# Patient Record
Sex: Female | Born: 1935
Health system: Southern US, Community
[De-identification: ages and names within clinical notes are randomized; demographics above are authoritative.]

## PROBLEM LIST (undated history)

## (undated) HISTORY — PX: TONSILLECTOMY: SUR1361

---

## 2002-09-10 ENCOUNTER — Emergency Department (HOSPITAL_COMMUNITY): Admission: EM | Admit: 2002-09-10 | Discharge: 2002-09-10 | Payer: Self-pay | Admitting: Emergency Medicine

## 2018-07-03 ENCOUNTER — Emergency Department
Admission: EM | Admit: 2018-07-03 | Discharge: 2018-07-03 | Disposition: A | Discharge status: Home / Self Care | Payer: PPO | Attending: Emergency Medicine | Admitting: Emergency Medicine

## 2018-07-03 ENCOUNTER — Emergency Department: Payer: PPO

## 2018-07-03 ENCOUNTER — Encounter: Payer: Self-pay | Admitting: Emergency Medicine

## 2018-07-03 ENCOUNTER — Other Ambulatory Visit: Payer: Self-pay

## 2018-07-03 DIAGNOSIS — R531 Weakness: Secondary | ICD-10-CM | POA: Diagnosis not present

## 2018-07-03 DIAGNOSIS — R29810 Facial weakness: Secondary | ICD-10-CM | POA: Diagnosis not present

## 2018-07-03 DIAGNOSIS — Q758 Other specified congenital malformations of skull and face bones: Secondary | ICD-10-CM | POA: Diagnosis not present

## 2018-07-03 DIAGNOSIS — Q670 Congenital facial asymmetry: Secondary | ICD-10-CM

## 2018-07-03 LAB — COMPREHENSIVE METABOLIC PANEL
ALT: 30 U/L (ref 0–44)
AST: 20 U/L (ref 15–41)
Albumin: 4.1 g/dL (ref 3.5–5.0)
Alkaline Phosphatase: 86 U/L (ref 38–126)
Anion gap: 10 (ref 5–15)
BILIRUBIN TOTAL: 0.8 mg/dL (ref 0.3–1.2)
BUN: 16 mg/dL (ref 8–23)
CO2: 22 mmol/L (ref 22–32)
CREATININE: 0.56 mg/dL (ref 0.44–1.00)
Calcium: 9.4 mg/dL (ref 8.9–10.3)
Chloride: 104 mmol/L (ref 98–111)
GFR calc non Af Amer: >60 mL/min (ref >60)
Glucose, Bld: 383 mg/dL — ABNORMAL HIGH (ref 70–99)
POTASSIUM: 3.8 mmol/L (ref 3.5–5.1)
Sodium: 136 mmol/L (ref 135–145)
TOTAL PROTEIN: 7.3 g/dL (ref 6.5–8.1)

## 2018-07-03 LAB — DIFFERENTIAL
BASOS ABS: 0.1 10*3/uL (ref 0–0.1)
BASOS PCT: 1 %
EOS ABS: 0.1 10*3/uL (ref 0–0.7)
Eosinophils Relative: 1 %
LYMPHS ABS: 2.1 10*3/uL (ref 1.0–3.6)
Lymphocytes Relative: 23 %
MONO ABS: 0.5 10*3/uL (ref 0.2–0.9)
Monocytes Relative: 5 %
Neutro Abs: 6.2 10*3/uL (ref 1.4–6.5)
Neutrophils Relative %: 70 %

## 2018-07-03 LAB — CBC
HEMATOCRIT: 41.4 % (ref 35.0–47.0)
HEMOGLOBIN: 14.5 g/dL (ref 12.0–16.0)
MCH: 29.6 pg (ref 26.0–34.0)
MCHC: 34.9 g/dL (ref 32.0–36.0)
MCV: 84.9 fL (ref 80.0–100.0)
Platelets: 173 10*3/uL (ref 150–440)
RBC: 4.88 MIL/uL (ref 3.80–5.20)
RDW: 13.1 % (ref 11.5–14.5)
WBC: 8.9 10*3/uL (ref 3.6–11.0)

## 2018-07-03 LAB — TROPONIN I

## 2018-07-03 LAB — PROTIME-INR
INR: 0.97
Prothrombin Time: 12.8 seconds (ref 11.4–15.2)

## 2018-07-03 LAB — APTT: APTT: 25 s (ref 24–36)

## 2018-07-03 MED ORDER — BLOOD GLUCOSE MONITOR KIT: PACK | 0 refills | Status: DC

## 2018-07-03 MED ORDER — ACYCLOVIR 400 MG PO TABS: 400.0000 mg | ORAL_TABLET | Freq: Every day | ORAL | 0 refills | Status: AC

## 2018-07-03 NOTE — ED Provider Notes (Signed)
[facility] Emergency Department Provider Note   ____________________________________________   I have reviewed the triage vital signs and the nursing notes.   HISTORY  Chief Complaint Facial Droop   History limited by: Not Limited   HPI Melissa Armstrong is a 82 y.o. female who presents to the emergency department today because of concerns for right-sided facial weakness.  Patient states that the symptoms started 2 days ago.  Slightly unclear why she waited until today to come in.  She noticed that her right side of her mouth is not working appropriately.  Additionally she notes it was hard to wake with the right eye.  Patient denies any extremity weakness or numbness.  She denies any recent illness, fever, chest pain, shortness of breath, abdominal pain nausea vomiting.  Denies similar symptoms in the past.  She states it is been years since she last went to a doctor.    Per medical record review patient has a history of tonsillectomy  History reviewed. No pertinent past medical history.  There are no active problems to display for this patient.   Past Surgical History:  Procedure Laterality Date  . TONSILLECTOMY      Prior to Admission medications   Not on File    Allergies Patient has no known allergies.  History reviewed. No pertinent family history.  Social History Social History   Tobacco Use  . Smoking status: Never Smoker  . Smokeless tobacco: Never Used  Substance Use Topics  . Alcohol use: Never    Frequency: Never  . Drug use: Never    Review of Systems Constitutional: No fever/chills Eyes: No visual changes. ENT: No sore throat. Cardiovascular: Denies chest pain. Respiratory: Denies shortness of breath. Gastrointestinal: No abdominal pain.  No nausea, no vomiting.  No diarrhea.   Genitourinary: Negative for dysuria. Musculoskeletal: Negative for back pain. Skin: Negative for rash. Neurological: Positive for right sided  facial weakness.   ____________________________________________   PHYSICAL EXAM:  VITAL SIGNS: ED Triage Vitals [07/03/18 1333]  Enc Vitals Group     BP (!) 157/105     Pulse Rate (!) 102     Resp 18     Temp 98.4 F (36.9 C)     Temp Source Oral     SpO2 98 %     Weight 160 lb (72.6 kg)     Height 5' (1.524 m)     Head Circumference      Peak Flow      Pain Score 0   Constitutional: Alert and oriented.  Eyes: Conjunctivae are normal.  ENT      Head: Normocephalic and atraumatic.      Nose: No congestion/rhinnorhea.      Mouth/Throat: Mucous membranes are moist.      Neck: No stridor. Hematological/Lymphatic/Immunilogical: No cervical lymphadenopathy. Cardiovascular: Normal rate, regular rhythm.  No murmurs, rubs, or gallops. Respiratory: Normal respiratory effort without tachypnea nor retractions. Breath sounds are clear and equal bilaterally. No wheezes/rales/rhonchi. Gastrointestinal: Soft and non tender. No rebound. No guarding.  Genitourinary: Deferred Musculoskeletal: Normal range of motion in all extremities. No lower extremity edema. Neurologic:  Right sided facial droop, weakness to right forehead. Decreased right periorbital muscles. No upper or lower extremity drift. Finger to nose normal.  Skin:  Skin is warm, dry and intact. No rash noted. Psychiatric: Mood and affect are normal. Speech and behavior are normal. Patient exhibits appropriate insight and judgment.  ____________________________________________    LABS (pertinent positives/negatives)  CBC  wnl CMP wnl except glu 383 Trop <0.03  ____________________________________________   EKG  I, Phineas SemenGraydon Zyon Grout, attending physician, personally viewed and interpreted this EKG  EKG Time: 1343 Rate: 114 Rhythm: sinus tachycardia Axis: normal Intervals: qtc 468 QRS: narrow ST changes: no st elevation Impression: abnormal ekg  ____________________________________________    RADIOLOGY  CT  head No acute findings  ____________________________________________   PROCEDURES  Procedures  ____________________________________________   INITIAL IMPRESSION / ASSESSMENT AND PLAN / ED COURSE  Pertinent labs & imaging results that were available during my care of the patient were reviewed by me and considered in my medical decision making (see chart for details).   Patient presented to the emergency department today because of concerns for right facial weakness for 2 days.  On exam patient's whole right side of her face is weak.  I did have a discussion with the patient.  At this point I think Bell's palsy likely given the involvement of the forehead.  However I did discuss with the patient the importance of an MRI to evaluate for stroke.  Patient however declined MRI at this time.  Did discuss risk of possibly missing a stroke including death and permanent disability.  Additionally discussed with patient finding of elevated glucose.  She does not have any history of diabetes.  She is not interested in starting medication at this time she would rather try diet changes.  At this point given that the patient declines MRI will treat for Bell's palsy.  However given elevated blood sugar and that patient is not on any medications I am hesitant to give steroids.  Will trial patient on acyclovir.  Patient states she will follow-up with primary care.   ____________________________________________   FINAL CLINICAL IMPRESSION(S) / ED DIAGNOSES  Final diagnoses:  Facial asymmetry     Note: This dictation was prepared with Dragon dictation. Any transcriptional errors that result from this process are unintentional     Phineas SemenGoodman, Gordon Carlson, MD 07/03/18 [phone]

## 2018-07-03 NOTE — ED Notes (Signed)
FIRST NURSE NOTE: [facility] pt has been having RT sided facial droop since Saturday. PT in NAD at this time

## 2018-07-03 NOTE — ED Triage Notes (Signed)
Pt to ED from home c/o right facial droop since 1600 Saturday suddenly, denies numbness or tingling, (+) sensation bilaterally, A&Ox4 speaking in complete and coherent sentences.  Denies hx of stroke, blood thinners, or other pertinent medical hx.

## 2018-07-03 NOTE — Discharge Instructions (Addendum)
Please seek medical attention for any high fevers, chest pain, shortness of breath, change in behavior, persistent vomiting, bloody stool or any other new or concerning symptoms.  

## 2018-08-09 DIAGNOSIS — Z0000 Encounter for general adult medical examination without abnormal findings: Secondary | ICD-10-CM | POA: Diagnosis not present

## 2018-08-09 DIAGNOSIS — I10 Essential (primary) hypertension: Secondary | ICD-10-CM | POA: Diagnosis not present

## 2018-08-09 DIAGNOSIS — E119 Type 2 diabetes mellitus without complications: Secondary | ICD-10-CM | POA: Diagnosis not present

## 2018-11-08 DIAGNOSIS — E118 Type 2 diabetes mellitus with unspecified complications: Secondary | ICD-10-CM | POA: Diagnosis not present

## 2018-11-08 DIAGNOSIS — E119 Type 2 diabetes mellitus without complications: Secondary | ICD-10-CM | POA: Diagnosis not present

## 2018-11-08 DIAGNOSIS — I10 Essential (primary) hypertension: Secondary | ICD-10-CM | POA: Diagnosis not present

## 2018-11-08 DIAGNOSIS — Z23 Encounter for immunization: Secondary | ICD-10-CM | POA: Diagnosis not present

## 2019-03-28 DIAGNOSIS — F411 Generalized anxiety disorder: Secondary | ICD-10-CM | POA: Insufficient documentation

## 2019-03-28 DIAGNOSIS — J309 Allergic rhinitis, unspecified: Secondary | ICD-10-CM | POA: Diagnosis not present

## 2019-05-10 DIAGNOSIS — F411 Generalized anxiety disorder: Secondary | ICD-10-CM | POA: Diagnosis not present

## 2019-08-07 DIAGNOSIS — F411 Generalized anxiety disorder: Secondary | ICD-10-CM | POA: Diagnosis not present

## 2019-09-12 IMAGING — CT CT HEAD W/O CM
3 series · 15 of 47 positions shown, 18 images · non-contrast
Comparison: None.

CLINICAL DATA: Right facial droop beginning 2 days ago.

EXAM:
CT HEAD WITHOUT CONTRAST
TECHNIQUE: Contiguous axial images were obtained from the base of the skull
through the vertex without intravenous contrast.

[Series 2: head wo · axial · 0.43mm/px · z∈[-106,+19]mm · 9 of 30 slices shown, 12 images]
[im 3/30  brain]
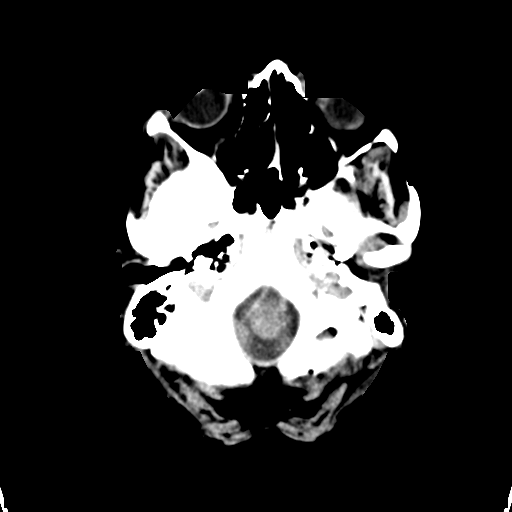
[im 3/30  bone]
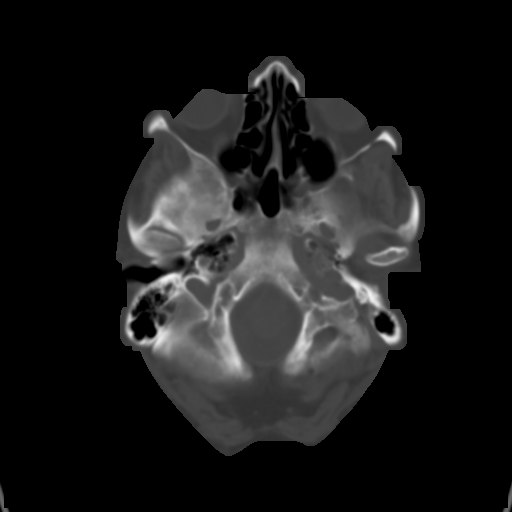
[im 6/30  brain]
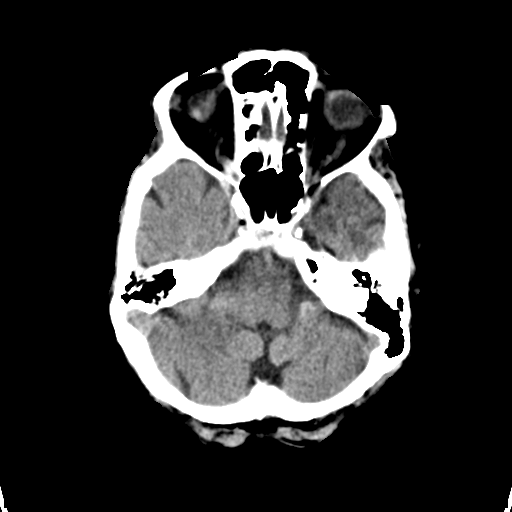
[im 9/30  brain]
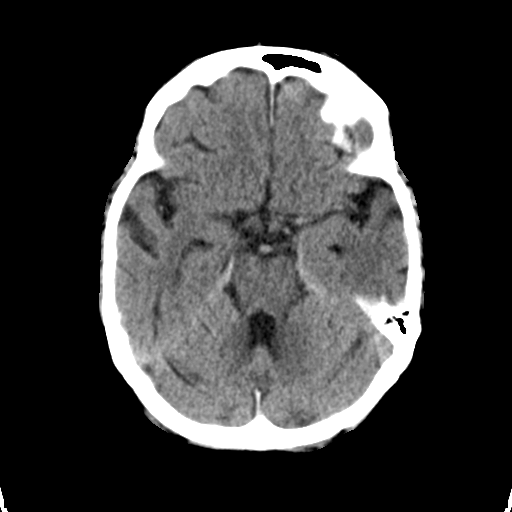
[im 12/30  brain]
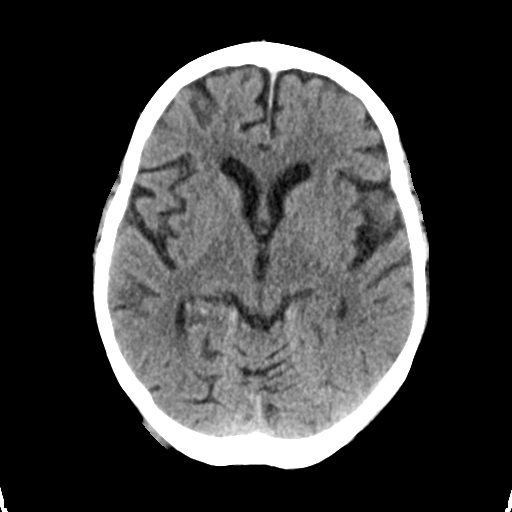
[im 16/30  brain]
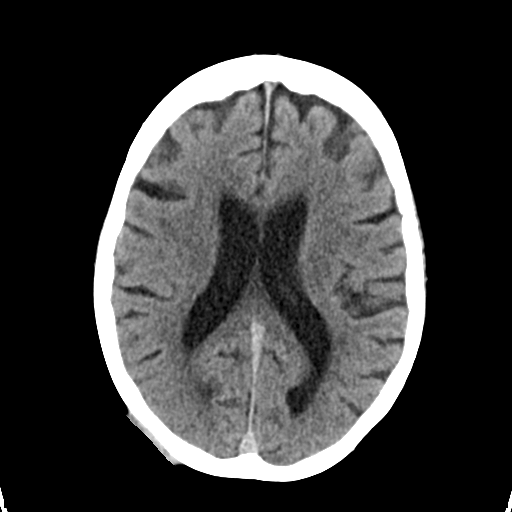
[im 16/30  bone]
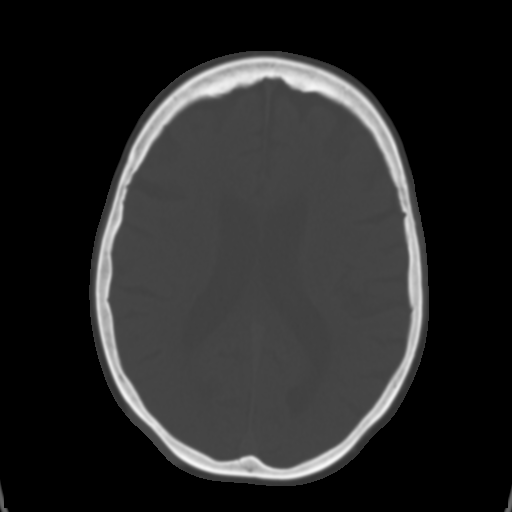
[im 19/30  brain]
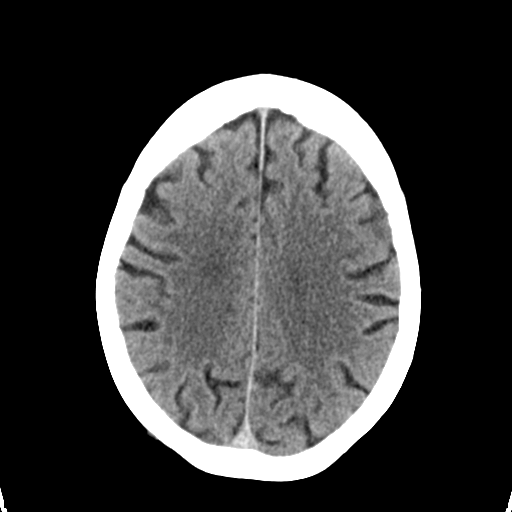
[im 22/30  brain]
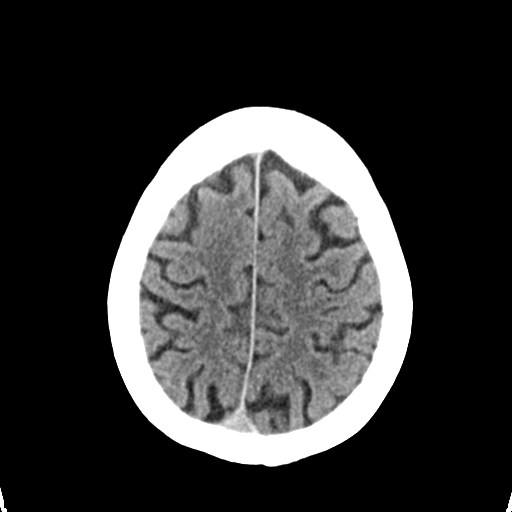
[im 25/30  brain]
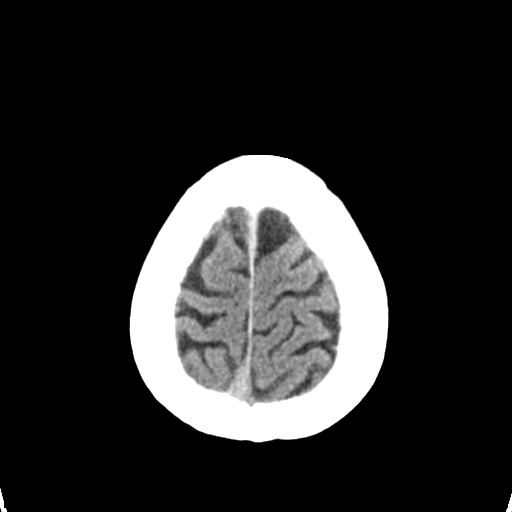
[im 28/30  brain]
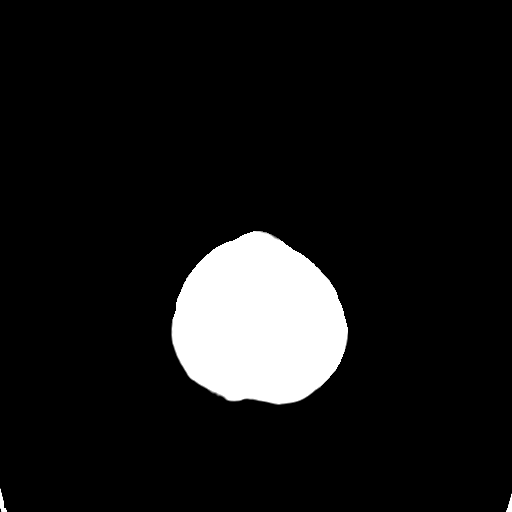
[im 28/30  bone]
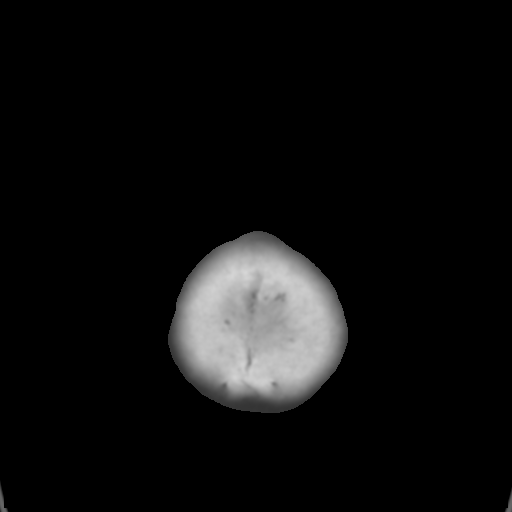

[Series 4: coronal soft tissue · coronal · 0.32mm/px · 3 of 67 slices shown]
[im 23/67  brain]
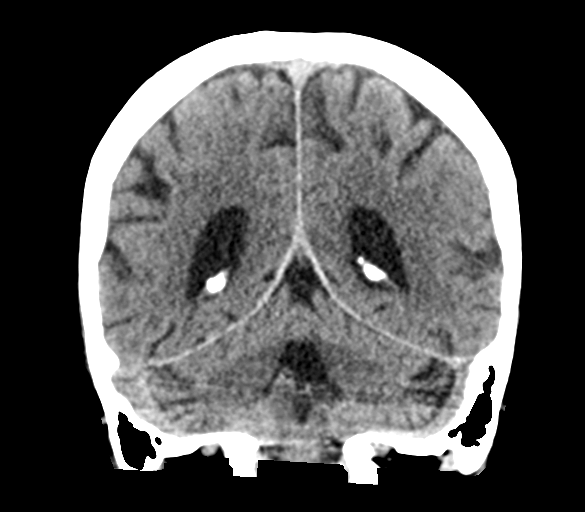
[im 30/67  brain]
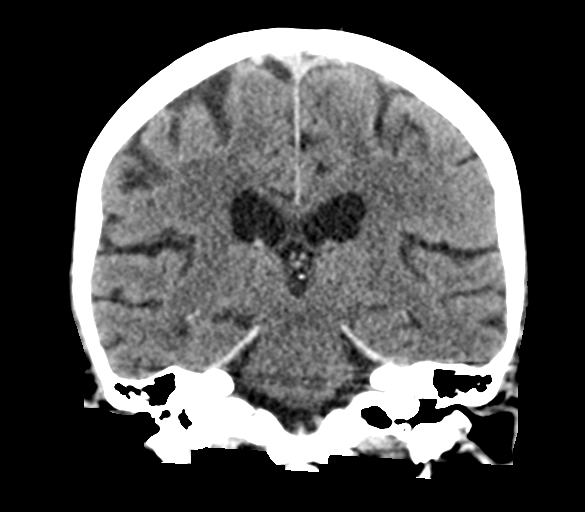
[im 37/67  brain]
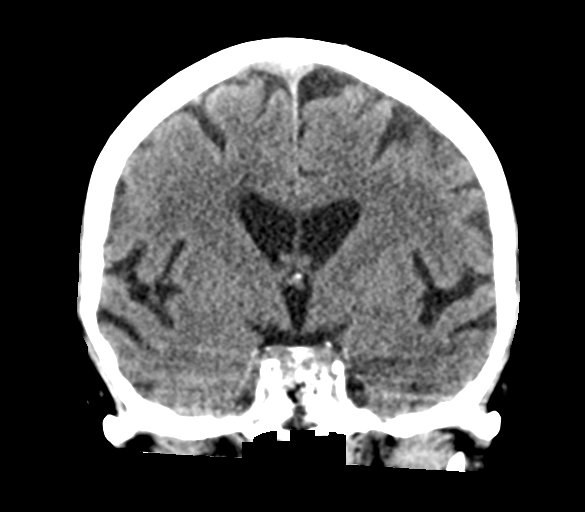

[Series 5: sagittal soft tissue · sagittal · 0.34mm/px · 3 of 55 slices shown]
[im 19/55  brain]
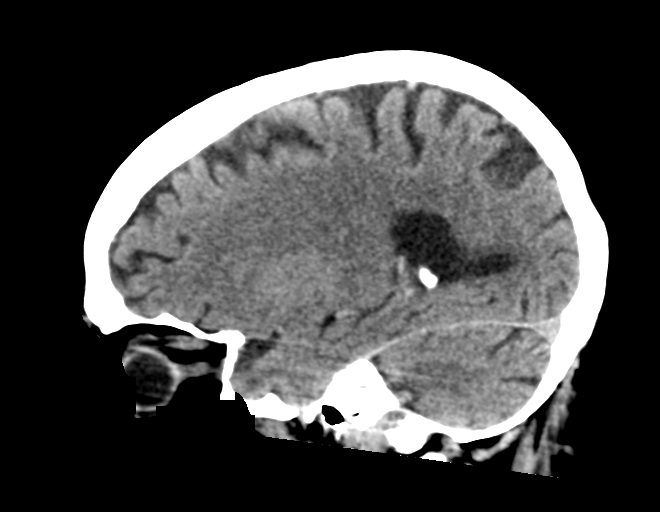
[im 28/55  brain]
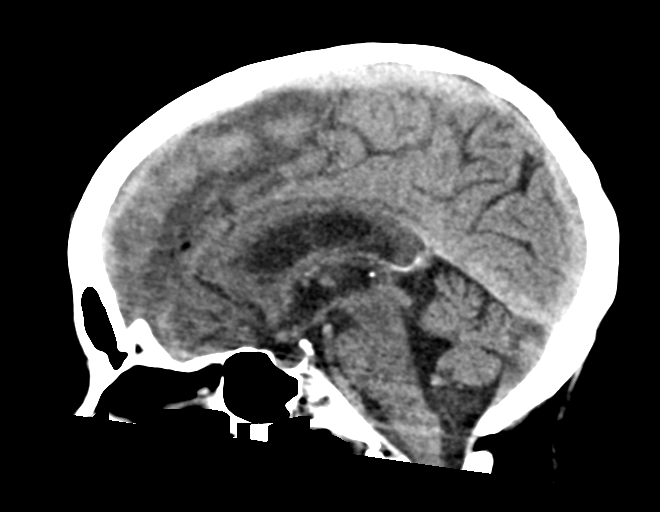
[im 37/55  brain]
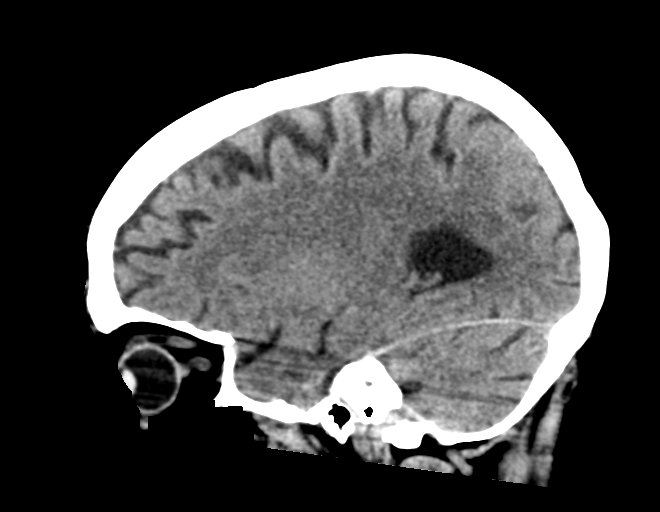

[15 of 47 positions shown; findings below may reference images not displayed]

FINDINGS: Brain: Age related volume loss. No evidence of old or acute focal
infarction, mass lesion, hemorrhage, hydrocephalus or extra-axial
collection.

Vascular: There is atherosclerotic calcification of the major
vessels at the base of the brain.

Skull: Negative

Sinuses/Orbits: Clear/normal

Other: Abnormal density in the fat of the right parietal scalp,
presumably a chronic skin or scalp lesion.
IMPRESSION: No acute or subacute finding. No cause of the presenting symptoms is
identified. Age related volume loss.

Presumably chronic skin or scalp lesion in the right parietal
region. Nonspecific. No abnormality of the underlying calvarium.

## 2019-09-19 DIAGNOSIS — F411 Generalized anxiety disorder: Secondary | ICD-10-CM | POA: Diagnosis not present

## 2023-08-16 DIAGNOSIS — Z01818 Encounter for other preprocedural examination: Secondary | ICD-10-CM | POA: Diagnosis not present

## 2023-08-16 DIAGNOSIS — H25813 Combined forms of age-related cataract, bilateral: Secondary | ICD-10-CM | POA: Diagnosis not present

## 2023-09-08 DIAGNOSIS — H25811 Combined forms of age-related cataract, right eye: Secondary | ICD-10-CM | POA: Diagnosis not present

## 2023-10-13 DIAGNOSIS — H25812 Combined forms of age-related cataract, left eye: Secondary | ICD-10-CM | POA: Diagnosis not present

## 2023-12-26 DIAGNOSIS — R030 Elevated blood-pressure reading, without diagnosis of hypertension: Secondary | ICD-10-CM | POA: Diagnosis not present

## 2023-12-26 DIAGNOSIS — M533 Sacrococcygeal disorders, not elsewhere classified: Secondary | ICD-10-CM | POA: Diagnosis not present

## 2023-12-26 DIAGNOSIS — M160 Bilateral primary osteoarthritis of hip: Secondary | ICD-10-CM | POA: Diagnosis not present

## 2023-12-26 DIAGNOSIS — F411 Generalized anxiety disorder: Secondary | ICD-10-CM | POA: Diagnosis not present

## 2024-03-13 DIAGNOSIS — Z136 Encounter for screening for cardiovascular disorders: Secondary | ICD-10-CM | POA: Diagnosis not present

## 2024-03-13 DIAGNOSIS — R030 Elevated blood-pressure reading, without diagnosis of hypertension: Secondary | ICD-10-CM | POA: Diagnosis not present

## 2024-03-13 DIAGNOSIS — F411 Generalized anxiety disorder: Secondary | ICD-10-CM | POA: Diagnosis not present

## 2024-05-11 ENCOUNTER — Other Ambulatory Visit: Payer: Self-pay

## 2024-05-11 DIAGNOSIS — M79651 Pain in right thigh: Secondary | ICD-10-CM | POA: Diagnosis not present

## 2024-05-11 DIAGNOSIS — M899 Disorder of bone, unspecified: Secondary | ICD-10-CM

## 2024-05-11 DIAGNOSIS — M25551 Pain in right hip: Secondary | ICD-10-CM | POA: Diagnosis not present

## 2024-05-13 ENCOUNTER — Ambulatory Visit

## 2024-05-17 ENCOUNTER — Ambulatory Visit
Admission: RE | Admit: 2024-05-17 | Discharge: 2024-05-17 | Disposition: A | Discharge status: Home / Self Care | Source: Ambulatory Visit

## 2024-05-17 DIAGNOSIS — M899 Disorder of bone, unspecified: Secondary | ICD-10-CM | POA: Insufficient documentation

## 2024-05-17 DIAGNOSIS — M79604 Pain in right leg: Secondary | ICD-10-CM | POA: Diagnosis not present

## 2024-05-17 DIAGNOSIS — M898X5 Other specified disorders of bone, thigh: Secondary | ICD-10-CM | POA: Diagnosis not present

## 2024-05-17 MED ORDER — GADOBUTROL 1 MMOL/ML IV SOLN
7.0000 mL | Freq: Once | INTRAVENOUS | Status: AC | PRN
  Administered 2024-05-17: 7 mL via INTRAVENOUS

## 2024-05-29 ENCOUNTER — Encounter: Payer: Self-pay | Admitting: Oncology

## 2024-05-29 ENCOUNTER — Other Ambulatory Visit: Payer: Self-pay | Admitting: *Deleted

## 2024-05-29 ENCOUNTER — Inpatient Hospital Stay

## 2024-05-29 ENCOUNTER — Inpatient Hospital Stay: Attending: Oncology | Admitting: Oncology

## 2024-05-29 VITALS — BP 151/84 | HR 102 | Temp 98.5°F | Resp 16 | Ht 60.0 in

## 2024-05-29 DIAGNOSIS — R978 Other abnormal tumor markers: Secondary | ICD-10-CM | POA: Diagnosis not present

## 2024-05-29 DIAGNOSIS — Z800 Family history of malignant neoplasm of digestive organs: Secondary | ICD-10-CM | POA: Diagnosis not present

## 2024-05-29 DIAGNOSIS — D649 Anemia, unspecified: Secondary | ICD-10-CM | POA: Diagnosis not present

## 2024-05-29 DIAGNOSIS — Z8041 Family history of malignant neoplasm of ovary: Secondary | ICD-10-CM | POA: Insufficient documentation

## 2024-05-29 DIAGNOSIS — F419 Anxiety disorder, unspecified: Secondary | ICD-10-CM | POA: Insufficient documentation

## 2024-05-29 DIAGNOSIS — Z801 Family history of malignant neoplasm of trachea, bronchus and lung: Secondary | ICD-10-CM | POA: Diagnosis not present

## 2024-05-29 DIAGNOSIS — L989 Disorder of the skin and subcutaneous tissue, unspecified: Secondary | ICD-10-CM | POA: Insufficient documentation

## 2024-05-29 DIAGNOSIS — M898X5 Other specified disorders of bone, thigh: Secondary | ICD-10-CM | POA: Diagnosis present

## 2024-05-29 DIAGNOSIS — M899 Disorder of bone, unspecified: Secondary | ICD-10-CM

## 2024-05-29 DIAGNOSIS — D696 Thrombocytopenia, unspecified: Secondary | ICD-10-CM | POA: Insufficient documentation

## 2024-05-29 LAB — CBC WITH DIFFERENTIAL/PLATELET
Abs Immature Granulocytes: 0.03 10*3/uL (ref 0.00–0.07)
Basophils Absolute: 0 10*3/uL (ref 0.0–0.1)
Basophils Relative: 0 %
Eosinophils Absolute: 0.1 10*3/uL (ref 0.0–0.5)
Eosinophils Relative: 1 %
HCT: 32.1 % — ABNORMAL LOW (ref 36.0–46.0)
Hemoglobin: 11.1 g/dL — ABNORMAL LOW (ref 12.0–15.0)
Immature Granulocytes: 0 %
Lymphocytes Relative: 16 %
Lymphs Abs: 1.2 10*3/uL (ref 0.7–4.0)
MCH: 29.8 pg (ref 26.0–34.0)
MCHC: 34.6 g/dL (ref 30.0–36.0)
MCV: 86.1 fL (ref 80.0–100.0)
Monocytes Absolute: 0.4 10*3/uL (ref 0.1–1.0)
Monocytes Relative: 6 %
Neutro Abs: 5.6 10*3/uL (ref 1.7–7.7)
Neutrophils Relative %: 77 %
Platelets: 143 10*3/uL — ABNORMAL LOW (ref 150–400)
RBC: 3.73 MIL/uL — ABNORMAL LOW (ref 3.87–5.11)
RDW: 13.3 % (ref 11.5–15.5)
WBC: 7.4 10*3/uL (ref 4.0–10.5)
nRBC: 0 % (ref 0.0–0.2)

## 2024-05-29 LAB — COMPREHENSIVE METABOLIC PANEL WITH GFR
ALT: 29 U/L (ref 0–44)
AST: 21 U/L (ref 15–41)
Albumin: 4.2 g/dL (ref 3.5–5.0)
Alkaline Phosphatase: 69 U/L (ref 38–126)
Anion gap: 8 (ref 5–15)
BUN: 20 mg/dL (ref 8–23)
CO2: 23 mmol/L (ref 22–32)
Calcium: 9.2 mg/dL (ref 8.9–10.3)
Chloride: 104 mmol/L (ref 98–111)
Creatinine, Ser: 0.6 mg/dL (ref 0.44–1.00)
GFR, Estimated: >60 mL/min (ref >60)
Glucose, Bld: 129 mg/dL — ABNORMAL HIGH (ref 70–99)
Potassium: 3.5 mmol/L (ref 3.5–5.1)
Sodium: 135 mmol/L (ref 135–145)
Total Bilirubin: 1 mg/dL (ref 0.0–1.2)
Total Protein: 7.1 g/dL (ref 6.5–8.1)

## 2024-05-29 MED ORDER — HYDROCODONE-ACETAMINOPHEN 5-325 MG PO TABS: 1.0000 | ORAL_TABLET | Freq: Four times a day (QID) | ORAL | 0 refills | Status: DC | PRN

## 2024-05-29 NOTE — Progress Notes (Signed)
 Patient wants to see if she can get something sent in for anxiety. She is very anxious and shaky today.

## 2024-05-29 NOTE — Progress Notes (Signed)
 [facility]  Telephone:([phone] [phone]) [phone]  ID: Del Favia OB: 08/16/1936  MR#: [id number]  AOZ#:[id number]  Patient Care Team: Monique Ano, MD as PCP - General (Family Medicine) Shellie Dials, MD as Consulting Physician (Oncology)  CHIEF COMPLAINT: Right femur lesion.  INTERVAL HISTORY: Patient is an 88 year old female who underwent imaging with x-ray and MRI for right leg pain.  MRI noted a well-circumscribed lesion in her right femur of unclear etiology, but is possibly suspicious for malignancy.  She also reports a large lesion on the back of her scalp that she has been ignoring for months and afraid to discuss.  This appears to be consistent with a squamous cell carcinoma.  She otherwise feels well.  She has no neurologic complaints.  She denies any recent fevers or illnesses.  She has a good appetite and denies weight loss.  She has no chest pain, shortness of breath, cough, or hemoptysis.  She denies any nausea, vomiting, constipation, or diarrhea.  She has no melena or hematochezia.  She has no urinary complaints.  Patient offers no further specific complaints today.  REVIEW OF SYSTEMS:   Review of Systems  Constitutional: Negative.  Negative for fever, malaise/fatigue and weight loss.  Respiratory: Negative.  Negative for cough, hemoptysis and shortness of breath.   Cardiovascular: Negative.  Negative for chest pain and leg swelling.  Gastrointestinal:  Negative for abdominal pain, blood in stool and melena.  Genitourinary: Negative.  Negative for frequency.  Musculoskeletal:        Right upper leg pain.  Skin: Negative.  Negative for rash.  Neurological: Negative.  Negative for dizziness, focal weakness, weakness and headaches.  Psychiatric/Behavioral: Negative.  The patient is not nervous/anxious.     As per HPI. Otherwise, a complete review of systems is negative.  PAST MEDICAL HISTORY: History reviewed. No pertinent past  medical history.  PAST SURGICAL HISTORY: Past Surgical History:  Procedure Laterality Date   TONSILLECTOMY      FAMILY HISTORY: Family History  Problem Relation Age of Onset   Cancer Mother        colon cancer   Cancer Father        Lung cancer   Cancer Sister        colon cancer   Cancer Niece        ovarian cancer    ADVANCED DIRECTIVES (Y/N):  N  HEALTH MAINTENANCE: Social History   Tobacco Use   Smoking status: Never   Smokeless tobacco: Never  Substance Use Topics   Alcohol use: Never   Drug use: Never     Colonoscopy:  PAP:  Bone density:  Lipid panel:  Allergies  Allergen Reactions   Penicillin G Other (See Comments)   Trazodone Nausea Only   Venlafaxine Diarrhea and Nausea Only    Current Outpatient Medications  Medication Sig Dispense Refill   acetaminophen (TYLENOL) 325 MG tablet Take 650 mg by mouth every 6 (six) hours as needed.     blood glucose meter kit and supplies KIT Dispense based on patient and insurance preference. Use up to four times daily as directed. (FOR ICD-9 250.00, 250.01). 1 each 0   traMADol (ULTRAM) 50 MG tablet Take 50 mg by mouth every 6 (six) hours as needed.     HYDROcodone-acetaminophen (NORCO/VICODIN) 5-325 MG tablet Take 1 tablet by mouth every 6 (six) hours as needed for moderate pain (pain score 4-6). 30 tablet 0   hydrOXYzine (ATARAX) 10 MG tablet Take 10  mg by mouth in the morning and at bedtime. (Patient not taking: Reported on 05/29/2024)     No current facility-administered medications for this visit.    OBJECTIVE: Vitals:   05/29/24 1320  BP: (!) 151/84  Pulse: (!) 102  Resp: 16  Temp: 98.5 F (36.9 C)  SpO2: 98%     Body mass index is 31.25 kg/m.    ECOG FS:1 - Symptomatic but completely ambulatory  General: Well-developed, well-nourished, no acute distress. Eyes: Pink conjunctiva, anicteric sclera. HEENT: Normocephalic, moist mucous membranes. Lungs: No audible wheezing or coughing. Heart: Regular  rate and rhythm. Abdomen: Soft, nontender, no obvious distention. Musculoskeletal: No edema, cyanosis, or clubbing. Neuro: Alert, answering all questions appropriately. Cranial nerves grossly intact. Skin: No rashes or petechiae noted. Psych: Normal affect. Lymphatics: No cervical, calvicular, axillary or inguinal LAD.   LAB RESULTS:  Lab Results  Component Value Date   NA 135 05/29/2024   K 3.5 05/29/2024   CL 104 05/29/2024   CO2 23 05/29/2024   GLUCOSE 129 (H) 05/29/2024   BUN 20 05/29/2024   CREATININE 0.60 05/29/2024   CALCIUM 9.2 05/29/2024   PROT 7.1 05/29/2024   ALBUMIN 4.2 05/29/2024   AST 21 05/29/2024   ALT 29 05/29/2024   ALKPHOS 69 05/29/2024   BILITOT 1.0 05/29/2024   GFRNONAA >60 05/29/2024   GFRAA >60 07/03/2018    Lab Results  Component Value Date   WBC 7.4 05/29/2024   NEUTROABS 5.6 05/29/2024   HGB 11.1 (L) 05/29/2024   HCT 32.1 (L) 05/29/2024   MCV 86.1 05/29/2024   PLT 143 (L) 05/29/2024     STUDIES: MR FEMUR RIGHT W WO CONTRAST Addendum Date: 05/17/2024 ADDENDUM REPORT: 05/17/2024 16:25 THE IMPRESSION 2.  WAS BLANK ON THE ORIGINAL REPORT.  THIS SHOULD READ: 2. Findings are concerning for a primary or secondary malignancy within the distal femoral diaphysis. An aggressive process of infection is considered, however the lesion appears well-circumscribed, and a bone malignancy is of greatest concern. Electronically Signed   By: Bertina Broccoli M.D.   On: 05/17/2024 16:25   Result Date: 05/17/2024 CLINICAL DATA:  Right leg pain.  Bony lesion seen on outside x-rays. EXAM: MRI OF THE RIGHT FEMUR WITHOUT AND WITH CONTRAST TECHNIQUE: Multiplanar, multisequence MR imaging of the right femur was performed both before and after administration of intravenous contrast. CONTRAST:  7mL GADAVIST  GADOBUTROL  1 MMOL/ML IV SOLN COMPARISON:  None Available. FINDINGS: Please note that interpretation has been requested on this MRI even though the outside radiographs on  which a bone lesion was reportedly seen are unavailable. Please note that inability to correlate with these outside radiographs limits this MRI interpretation. Bones/Joint/Cartilage There is an oval, well-circumscribed lesion within the distal femoral diaphysis measuring up to 2.5 x 2.2 x 4.7 cm (transverse by AP by craniocaudal) demonstrating decreased T1 signal (slightly hyperintense to skeletal muscle), intermediate T2 signal, and moderate diffuse enhancement. This causes moderate thinning of the anterior and anteromedial distal femoral diaphyseal cortex (axial series 13 image 54). There is apparent aggressive periosteal reaction within the anterior aspect of this lesion (axial series 13, image 54, sagittal series 32, image 20). There is fluid tracking along the lateral aspect of the distal femur, deep to the vastus lateralis and posterior the vastus intermedius muscles, measuring up to 5 mm in transverse thickness, and wrapping around the anterior cortex, along an approximate 12 cm craniocaudal length of the bone. There is also patchy intermediate T2 signal within the femoral diaphysis  just proximal to this lesion (axial series 29, image 51 and sagittal series 16, image 19) and within the anterior distal femoral metadiaphysis just distal to the dominant bone lesion (axial series 29, image 62 and sagittal series 16, image 19). There is a nonspecific intermediate T1, intermediate T2, and mildly enhancing oval lesion within the mid femoral diaphysis just proximal to the mid height, measuring up to approximately 8 x 12 x 14 mm (transverse by AP by craniocaudal). It is difficult to exclude an additional aggressive bone lesion, although this could represent focal red marrow reconversion. Moderate to severe pubic symphysis joint space narrowing and peripheral osteophytosis. Ligaments The knee mediolateral collateral ligaments appear intact. Muscles and Tendons There is mild intermediate T2 signal likely complex fluid  and periosteal reaction around the distal femoral bone tumor extending into the adjacent deep aspect of the vastus intermedius muscle. No definite tendon tear is seen. Soft tissues The uterus is in the partially visualized. It appears heterogeneous and multilobular, grossly compatible with fibroids. There is a partially visualized oval, decreased T1 and decreased T2 signal focus within the right hemipelvis that measuring up to approximately 3.7 cm that may represent a stool ball (axial image 4/80), with mild mass effect on the superior right urinary bladder. It is difficult to exclude a right adnexal lesion on this limited study. Recommend correlation with dedicated pelvic ultrasound or CT of the abdomen pelvis with contrast, as may be desired for a metastatic workup given the distal right femoral bone lesion. IMPRESSION: 1. Aggressive oval lesion within the distal femoral diaphysis with thinning of the anterior cortex, moderate periosteal edema, and aggressive anterior periosteal reaction. 2. 3. There is a nonspecific intermediate T1, intermediate T2, and mildly enhancing oval lesion within the mid femoral diaphysis just proximal to the mid height, measuring up to approximately 8 x 12 x 14 mm (TR x AP x CC). It is difficult to exclude an additional aggressive bone lesion, although this could represent focal red marrow reconversion. 4. The uterus is in the partially visualized and heterogeneous and multilobular, grossly compatible with fibroids. There is a partially visualized oval decreased T1 and decreased T2 signal focus within the right hemipelvis that may represent a stool ball. It is difficult to exclude a right adnexal lesion on this limited study. Recommend correlation with either dedicated pelvic ultrasound or CT of the abdomen pelvis with contrast, noting such a CT may be desired anyway for a metastatic workup given the distal right femoral bone lesion. Electronically Signed: By: Bertina Broccoli M.D. On:  05/17/2024 16:18    ASSESSMENT:  Right femur lesion.  PLAN:    Right femur lesion: MRI results from May 17, 2024 reviewed independently and report as above with an aggressive appearing lesion in the right femur as well as a second indeterminate lesion.  Unclear if this is malignancy, but will get a CT scan of the chest, abdomen, pelvis to assess for possible primary.  Will also get a nuclear med bone scan to assess for additional bony lesions.  Full myeloma workup is pending at time of dictation.  Patient will return to clinic in approximately 2 weeks after her imaging is completed to discuss the results. Scalp lesion: Patient reports this lesion has been growing for months and she has been afraid to discuss it.  It is approximately 6 cm in diameter and suspicious for a squamous cell carcinoma.  She also has a palpable right cervical lymph node but is unclear if this is malignant  or reactive.  Also unclear if this is related to her bony lesion in her femur.  Referral has been sent to dermatology for biopsy. Anemia: Patient's hemoglobin is mildly creased 11.1.  Monitor. Pain: Patient was given a prescription for Norco today.  I spent a total of 60 minutes reviewing chart data, face-to-face evaluation with the patient, counseling and coordination of care as detailed above.   Patient expressed understanding and was in agreement with this plan. She also understands that She can call clinic at any time with any questions, concerns, or complaints.    Cancer Staging  No matching staging information was found for the patient.   Shellie Dials, MD   05/29/2024 4:38 PM

## 2024-05-30 LAB — PROTEIN ELECTROPHORESIS, SERUM
A/G Ratio: 1.3 (ref 0.7–1.7)
Albumin ELP: 3.6 g/dL (ref 2.9–4.4)
Alpha-1-Globulin: 0.3 g/dL (ref 0.0–0.4)
Alpha-2-Globulin: 0.8 g/dL (ref 0.4–1.0)
Beta Globulin: 0.9 g/dL (ref 0.7–1.3)
Gamma Globulin: 0.8 g/dL (ref 0.4–1.8)
Globulin, Total: 2.8 g/dL (ref 2.2–3.9)
Total Protein ELP: 6.4 g/dL (ref 6.0–8.5)

## 2024-05-30 LAB — IGG, IGA, IGM
IgA: 158 mg/dL (ref 64–422)
IgG (Immunoglobin G), Serum: 813 mg/dL (ref 586–1602)
IgM (Immunoglobulin M), Srm: 87 mg/dL (ref 26–217)

## 2024-05-30 LAB — KAPPA/LAMBDA LIGHT CHAINS
Kappa free light chain: 16.7 mg/L (ref 3.3–19.4)
Kappa, lambda light chain ratio: 1.04 (ref 0.26–1.65)
Lambda free light chains: 16.1 mg/L (ref 5.7–26.3)

## 2024-05-30 LAB — CANCER ANTIGEN 27.29: CA 27.29: 90.2 U/mL — ABNORMAL HIGH (ref 0.0–38.6)

## 2024-06-01 ENCOUNTER — Telehealth: Payer: Self-pay | Admitting: *Deleted

## 2024-06-01 NOTE — Telephone Encounter (Signed)
 Patient had 1 tablet of Hydrocodone and it did not help her pain, took 1 Senokot and after 12 hours she had 1 little piece of stool.  She wants to know if she needs to have something else for her constipation and what about the pain medicine that did not work when she took it just 1 time, she was planning on taking another pain pill today

## 2024-06-01 NOTE — Telephone Encounter (Signed)
 Patient states she only took 1 pain pill and was not taking it q6h as prescribed. She states before she increases to 2 pills a day she will trying taking as written on the bottle. She also said she was able to finally have a really good BM so she feels a lot better. Will try Miralax prn if the issue occurs again.

## 2024-06-06 ENCOUNTER — Other Ambulatory Visit

## 2024-06-07 ENCOUNTER — Other Ambulatory Visit: Payer: Self-pay | Admitting: *Deleted

## 2024-06-07 ENCOUNTER — Telehealth: Payer: Self-pay | Admitting: *Deleted

## 2024-06-07 ENCOUNTER — Other Ambulatory Visit

## 2024-06-07 MED ORDER — HYDROCODONE-ACETAMINOPHEN 5-325 MG PO TABS: 1.0000 | ORAL_TABLET | Freq: Four times a day (QID) | ORAL | 0 refills | Status: DC | PRN

## 2024-06-07 NOTE — Telephone Encounter (Signed)
 Patient called in and said that she is almost out all of her pain medicine and needs a refill

## 2024-06-07 NOTE — Telephone Encounter (Signed)
 Patient called yesterday late in the evening and everyone has gone home she says that she just wanted to talk to Dr. Adrian Alba and see what he can do to help with the swelling.  It is bilateral feet and up to the legs.  She wants somebody to just try to give her some medicine or what ever they need need to do in order to get the swelling gone.

## 2024-06-08 ENCOUNTER — Telehealth: Payer: Self-pay | Admitting: *Deleted

## 2024-06-08 NOTE — Telephone Encounter (Signed)
 Patient want the med , hard to get up and do anything and she is swelling in feet and part of legs. She gets people to bring her to office and  scans

## 2024-06-11 ENCOUNTER — Other Ambulatory Visit: Payer: Self-pay | Admitting: *Deleted

## 2024-06-11 MED ORDER — DIAZEPAM 5 MG PO TABS: 5.0000 mg | ORAL_TABLET | Freq: Once | ORAL | 0 refills | Status: AC

## 2024-06-12 ENCOUNTER — Encounter
Admission: RE | Admit: 2024-06-12 | Discharge: 2024-06-12 | Discharge status: Home / Self Care | Source: Ambulatory Visit | Attending: Oncology | Admitting: Oncology

## 2024-06-12 ENCOUNTER — Encounter
Admission: RE | Admit: 2024-06-12 | Discharge: 2024-06-12 | Disposition: A | Discharge status: Home / Self Care | Source: Ambulatory Visit | Attending: Oncology | Admitting: Oncology

## 2024-06-12 DIAGNOSIS — M899 Disorder of bone, unspecified: Secondary | ICD-10-CM | POA: Diagnosis not present

## 2024-06-12 DIAGNOSIS — M898X8 Other specified disorders of bone, other site: Secondary | ICD-10-CM | POA: Diagnosis not present

## 2024-06-12 MED ORDER — TECHNETIUM TC 99M MEDRONATE IV KIT
20.0000 | PACK | Freq: Once | INTRAVENOUS | Status: AC | PRN
  Administered 2024-06-12: 20.88 via INTRAVENOUS

## 2024-06-13 ENCOUNTER — Inpatient Hospital Stay

## 2024-06-14 ENCOUNTER — Ambulatory Visit: Admitting: Oncology

## 2024-06-15 ENCOUNTER — Telehealth: Payer: Self-pay | Admitting: *Deleted

## 2024-06-15 ENCOUNTER — Other Ambulatory Visit: Payer: Self-pay | Admitting: *Deleted

## 2024-06-15 MED ORDER — HYDROCODONE-ACETAMINOPHEN 5-325 MG PO TABS: 1.0000 | ORAL_TABLET | Freq: Four times a day (QID) | ORAL | 0 refills | Status: DC | PRN

## 2024-06-15 NOTE — Telephone Encounter (Signed)
 The patient called and said that she needs a refill of her hydrocodone  and she would like a diazepam in order for her to come in and get the results from the doctor.  I spoke to the doctor and he will fill the hydrocodone  but he will not fill the diazepam for her to come in.  I called the patient and let her know that he will refill the hydrocodone .  She figured it that is what he would say but she still coming in for her appointment on Monday at 115

## 2024-06-19 ENCOUNTER — Ambulatory Visit: Admitting: Oncology

## 2024-06-19 ENCOUNTER — Inpatient Hospital Stay (HOSPITAL_BASED_OUTPATIENT_CLINIC_OR_DEPARTMENT_OTHER): Admitting: Oncology

## 2024-06-19 VITALS — BP 145/85 | HR 90 | Temp 98.0°F | Resp 18 | Ht 60.0 in

## 2024-06-19 DIAGNOSIS — L989 Disorder of the skin and subcutaneous tissue, unspecified: Secondary | ICD-10-CM

## 2024-06-19 DIAGNOSIS — F411 Generalized anxiety disorder: Secondary | ICD-10-CM

## 2024-06-19 DIAGNOSIS — M899 Disorder of bone, unspecified: Secondary | ICD-10-CM

## 2024-06-19 DIAGNOSIS — M898X5 Other specified disorders of bone, thigh: Secondary | ICD-10-CM | POA: Diagnosis not present

## 2024-06-19 NOTE — Progress Notes (Signed)
 [facility]  Telephone:([phone]) [phone] Fax:(336) [phone]  ID: Melissa Armstrong OB: 28-Mar-1936  MR#: [id number]  RDW#:[id number]  Patient Care Team: Alla Amis, MD as PCP - General (Family Medicine) Jacobo Evalene PARAS, MD as Consulting Physician (Oncology)  CHIEF COMPLAINT: Right femur lesion, right posterior scalp lesion.  INTERVAL HISTORY: Patient returns to clinic today for further evaluation and discussion of her imaging results.  She completed her nuclear med bone scan, but did not have her CT scan yet.  She continues to have right leg pain as well as anxiety.  She has no neurologic complaints.  She denies any recent fevers or illnesses.  She has a good appetite and denies weight loss.  She has no chest pain, shortness of breath, cough, or hemoptysis.  She denies any nausea, vomiting, constipation, or diarrhea.  She has no melena or hematochezia.  She has no urinary complaints.  Patient offers no further specific complaints today.  REVIEW OF SYSTEMS:   Review of Systems  Constitutional: Negative.  Negative for fever, malaise/fatigue and weight loss.  Respiratory: Negative.  Negative for cough, hemoptysis and shortness of breath.   Cardiovascular: Negative.  Negative for chest pain and leg swelling.  Gastrointestinal:  Negative for abdominal pain, blood in stool and melena.  Genitourinary: Negative.  Negative for frequency.  Musculoskeletal:        Right upper leg pain.  Skin: Negative.  Negative for rash.  Neurological: Negative.  Negative for dizziness, focal weakness, weakness and headaches.  Psychiatric/Behavioral:  The patient is nervous/anxious.     As per HPI. Otherwise, a complete review of systems is negative.  PAST MEDICAL HISTORY: No past medical history on file.  PAST SURGICAL HISTORY: Past Surgical History:  Procedure Laterality Date   TONSILLECTOMY      FAMILY HISTORY: Family History  Problem Relation Age of Onset   Cancer Mother         colon cancer   Cancer Father        Lung cancer   Cancer Sister        colon cancer   Cancer Niece        ovarian cancer    ADVANCED DIRECTIVES (Y/N):  N  HEALTH MAINTENANCE: Social History   Tobacco Use   Smoking status: Never   Smokeless tobacco: Never  Substance Use Topics   Alcohol use: Never   Drug use: Never     Colonoscopy:  PAP:  Bone density:  Lipid panel:  Allergies  Allergen Reactions   Penicillin G Other (See Comments)   Trazodone Nausea Only   Venlafaxine Diarrhea and Nausea Only    Current Outpatient Medications  Medication Sig Dispense Refill   HYDROcodone -acetaminophen  (NORCO/VICODIN) 5-325 MG tablet Take 1 tablet by mouth every 6 (six) hours as needed for moderate pain (pain score 4-6). 30 tablet 0   acetaminophen  (TYLENOL ) 325 MG tablet Take 650 mg by mouth every 6 (six) hours as needed. (Patient not taking: Reported on 06/19/2024)     blood glucose meter kit and supplies KIT Dispense based on patient and insurance preference. Use up to four times daily as directed. (FOR ICD-9 250.00, 250.01). (Patient not taking: Reported on 06/19/2024) 1 each 0   diazepam (VALIUM) 5 MG tablet Take 5 mg by mouth once. (Patient not taking: Reported on 06/19/2024)     No current facility-administered medications for this visit.    OBJECTIVE: Vitals:   06/19/24 1314  BP: (!) 145/85  Pulse: 90  Resp: 18  Temp: 98 F (36.7 C)  SpO2: 98%      Body mass index is 31.25 kg/m.    ECOG FS:1 - Symptomatic but completely ambulatory  General: Well-developed, well-nourished, no acute distress.  Sitting in a wheelchair. Eyes: Pink conjunctiva, anicteric sclera. HEENT: Normocephalic, moist mucous membranes. Lungs: No audible wheezing or coughing. Heart: Regular rate and rhythm. Abdomen: Soft, nontender, no obvious distention. Musculoskeletal: No edema, cyanosis, or clubbing. Neuro: Alert, answering all questions appropriately. Cranial nerves grossly intact. Skin: No  rashes or petechiae noted. Psych: Normal affect.  LAB RESULTS:  Lab Results  Component Value Date   NA 135 05/29/2024   K 3.5 05/29/2024   CL 104 05/29/2024   CO2 23 05/29/2024   GLUCOSE 129 (H) 05/29/2024   BUN 20 05/29/2024   CREATININE 0.60 05/29/2024   CALCIUM 9.2 05/29/2024   PROT 7.1 05/29/2024   ALBUMIN 4.2 05/29/2024   AST 21 05/29/2024   ALT 29 05/29/2024   ALKPHOS 69 05/29/2024   BILITOT 1.0 05/29/2024   GFRNONAA >60 05/29/2024   GFRAA >60 07/03/2018    Lab Results  Component Value Date   WBC 7.4 05/29/2024   NEUTROABS 5.6 05/29/2024   HGB 11.1 (L) 05/29/2024   HCT 32.1 (L) 05/29/2024   MCV 86.1 05/29/2024   PLT 143 (L) 05/29/2024     STUDIES: NM Bone Scan Whole Body Result Date: 06/12/2024 CLINICAL DATA:  Aggressive lesion in the RIGHT femur on MRI evaluation. EXAM: NUCLEAR MEDICINE WHOLE BODY BONE SCAN TECHNIQUE: Whole body anterior and posterior images were obtained approximately 3 hours after intravenous injection of radiopharmaceutical. RADIOPHARMACEUTICALS:  20.9 mCi Technetium-65m MDP IV COMPARISON:  MRI 05/18/2019 FINDINGS: There is intense radiotracer activity within the distal RIGHT femoral diaphysis which corresponds to abnormality on comparison MRI. Additionally, there is abnormal radiotracer accumulation with posterior RIGHT calvarium in the region posterolateral RIGHT occipital bone. IMPRESSION: 1. Radiotracer activity in the distal RIGHT femur corresponds to MRI findings. 2. Second lesion in the posterior RIGHT occipital bone. Recommend MRI or CT of the head for further evaluation. Electronically Signed   By: Jackquline Boxer M.D.   On: 06/12/2024 16:01    ASSESSMENT:  Right femur lesion.  PLAN:    Right femur lesion: MRI results from May 17, 2024 reviewed independently with an aggressive appearing lesion in the right femur as well as a second indeterminate lesion.  Nuclear med bone scan on June 12, 2024 corresponds with MRI findings.  The second  lesion in the posterior right occipital bone is likely related to the overlying lesion on her scalp.  Patient did not have her CT scan, therefore we will reschedule and include a head CT for additional evaluation.  Multiple myeloma workup is negative.  CA 27-29 is only mildly elevated at 90.2.return to clinic after her imaging to discuss the results and additional diagnostic testing.   Scalp lesion: Patient reports this lesion has been growing for months and she has been afraid to discuss it.  It is approximately 6 cm in diameter and suspicious for a squamous cell carcinoma.  She also has a palpable right cervical lymph node but is unclear if this is malignant or reactive.  It is unlikely related to the lesion in her femur, but this is also unclear.  Patient has an appointment with general surgery on June 21, 2024 for further evaluation and biopsy. Anemia: Patient's hemoglobin is mildly creased 11.1.  Monitor. Thrombocytopenia: Mild, monitor. Pain: Patient was given a prescription for Norco today.  Anxiety/coping: Patient was given a referral to [facility].  I spent a total of 30 minutes reviewing chart data, face-to-face evaluation with the patient, counseling and coordination of care as detailed above.    Patient expressed understanding and was in agreement with this plan. She also understands that She can call clinic at any time with any questions, concerns, or complaints.    Cancer Staging  No matching staging information was found for the patient.   Evalene JINNY Reusing, MD   06/19/2024 4:14 PM

## 2024-06-19 NOTE — Progress Notes (Signed)
 Has bilateral leg and feet swelling. The friend she has to get her meds will be out of town next week and wants to know if she can get her pain meds to last through next week so she does not run out.

## 2024-06-20 ENCOUNTER — Inpatient Hospital Stay: Admitting: Licensed Clinical Social Worker

## 2024-06-20 NOTE — Progress Notes (Signed)
 [facility] CSW Counseling Note  Patient was referred by medical provider. Treatment type: Individual  Presenting Concerns: Patient and/or family reports the following symptoms/concerns: anxiety Duration of problem: several years; Severity of problem: moderate   Orientation:oriented to person, place, time/date, situation, day of week, month of year, and year.   Affect: NA (phone conversation) Risk of harm to self or others: No plan to harm self or others  Patient and/or Family's Strengths/Protective Factors: Social connections and Concrete supports in place (healthy food, safe environments, etc.)Active sense of humor  Capable of independent living  Arboriculturist fund of knowledge  Religious Affiliation  Supportive family/friends      Goals Addressed: Patient will:  Reduce symptoms of: anxiety Increase knowledge and/or ability of: self-management skills  Increase healthy adjustment to current life circumstances   Progress towards Goals: Initial   Interventions: Interventions utilized:  Motivational Interviewing and Supportive Counseling  brief mental health assessment     Assessment: Patient currently experiencing anxiety.  She stated she has had anxiety since she was a child.  She has used valium as needed.  She reports that anytime a procedure is done to her involving needles she becomes anxious.  She said her begins shaking.  She also reports being claustrophobic.  She stated no other modalities other than valium will help reduce her symptoms.  She is interested in completing her advance directives after a treatment plan is decided.  Patient lives alone and has been a widow since 2009.  She does not have any children, but has several close friends and a supportive church.  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  She enjoys using her computer to shop, etc.      Plan: Follow up with CSW: CSW to phone patient in two weeks. Behavioral  recommendations: continue socialization with friends. Referral(s): None at this time.  Patient has already been referred to [facility].       Macario HERO Melissa Szatkowski, LCSW

## 2024-06-21 ENCOUNTER — Encounter: Payer: Self-pay | Admitting: Surgery

## 2024-06-21 ENCOUNTER — Ambulatory Visit: Payer: Self-pay | Admitting: Surgery

## 2024-06-21 VITALS — BP 162/76 | HR 82 | Temp 98.7°F | Ht 60.0 in | Wt 154.0 lb

## 2024-06-21 DIAGNOSIS — L989 Disorder of the skin and subcutaneous tissue, unspecified: Secondary | ICD-10-CM

## 2024-06-21 DIAGNOSIS — C4440 Unspecified malignant neoplasm of skin of scalp and neck: Secondary | ICD-10-CM | POA: Diagnosis not present

## 2024-06-21 DIAGNOSIS — C4449 Other specified malignant neoplasm of skin of scalp and neck: Secondary | ICD-10-CM | POA: Diagnosis not present

## 2024-06-21 NOTE — Progress Notes (Signed)
 Patient enrolled in [facility] services on 06/20/2024.  Initial behavioral health evaluation is scheduled for 07/17/2024.

## 2024-06-21 NOTE — Patient Instructions (Signed)
Skin Biopsy, Care After The following information offers guidance on how to care for yourself after your procedure. Your health care provider may also give you more specific instructions. If you have problems or questions, contact your health care provider. What can I expect after the procedure? After the procedure, it is common to have: Soreness or mild pain. Bruising. Itching. Some redness and swelling. Follow these instructions at home: Biopsy site care  Follow instructions from your health care provider about how to take care of your biopsy site. Make sure you: Wash your hands with soap and water for at least 20 seconds before and after you change your bandage (dressing). If soap and water are not available, use hand sanitizer. Change your dressing as told by your health care provider. Leave stitches (sutures), skin glue, or adhesive strips in place. These skin closures may need to stay in place for 2 weeks or longer. If adhesive strip edges start to loosen and curl up, you may trim the loose edges. Do not remove adhesive strips completely unless your health care provider tells you to do that. Check your biopsy site every day for signs of infection. Check for: More redness, swelling, or pain. Fluid or blood. Warmth. Pus or a bad smell. Do not take baths, swim, or use a hot tub until your health care provider approves. Ask your health care provider if you may take showers. You may only be allowed to take sponge baths. General instructions Take over-the-counter and prescription medicines only as told by your health care provider. Return to your normal activities as told by your health care provider. Ask your health care provider what activities are safe for you. Keep all follow-up visits. This is important. Contact a health care provider if: You have more redness, swelling, or pain around your biopsy site. You have fluid or blood coming from your biopsy site. Your biopsy site feels warm  to the touch. You have pus or a bad smell coming from your biopsy site. You have a fever. Your sutures, skin glue, or adhesive strips loosen or come off sooner than expected. Get help right away if: You have bleeding that does not stop with pressure or a dressing. Summary After the procedure, it is common to have soreness, bruising, and itching at the site. Follow instructions from your health care provider about how to take care of your biopsy site. Check your biopsy site every day for signs of infection. Contact a health care provider if you have more redness, swelling, or pain around your biopsy site, or your biopsy site feels warm to the touch. Keep all follow-up visits. This is important. This information is not intended to replace advice given to you by your health care provider. Make sure you discuss any questions you have with your health care provider. Document Revised: 07/14/2021 Document Reviewed: 07/14/2021 Elsevier Patient Education  2024 [employer].

## 2024-06-21 NOTE — Progress Notes (Signed)
 Patient ID: Melissa Armstrong, female   DOB: 01-02-1936, 88 y.o.   MRN: [id number]  Chief Complaint: Referral for punch biopsy of scalp lesion.  History of Present Illness Melissa Armstrong is a 88 y.o. female with an ongoing workup regarding, a lesion in her right femur.  Apparently she has had an area of hair loss and a skin lesion of her right occipital parietal area, significantly increasing over, what I believe is years/at least months.  No reported ulceration or drainage or bleeding.  But a wide area that she is covered with a cap, that she has been somewhat embarrassed by.  She does have a readily palpable lymph node in the right posterior triangle.  This is well-known.  She presents today for punch biopsy of the skin lesion.  Past Medical History History reviewed. No pertinent past medical history.    Past Surgical History:  Procedure Laterality Date   TONSILLECTOMY      Allergies  Allergen Reactions   Penicillin G Other (See Comments)   Trazodone Nausea Only   Venlafaxine Diarrhea and Nausea Only    Current Outpatient Medications  Medication Sig Dispense Refill   HYDROcodone -acetaminophen  (NORCO/VICODIN) 5-325 MG tablet Take 1 tablet by mouth every 6 (six) hours as needed for moderate pain (pain score 4-6). 30 tablet 0   diazepam (VALIUM) 5 MG tablet Take 5 mg by mouth once. (Patient not taking: Reported on 06/21/2024)     No current facility-administered medications for this visit.    Family History Family History  Problem Relation Age of Onset   Cancer Mother        colon cancer   Cancer Father        Lung cancer   Cancer Sister        colon cancer   Cancer Niece        ovarian cancer      Social History Social History   Tobacco Use   Smoking status: Never   Smokeless tobacco: Never  Substance Use Topics   Alcohol use: Never   Drug use: Never        Review of Systems  Constitutional:  Negative for fever, malaise/fatigue and weight loss.  HENT: Negative.     Eyes: Negative.   Respiratory:  Negative for cough, hemoptysis and shortness of breath.   Cardiovascular:  Positive for leg swelling. Negative for chest pain.  Gastrointestinal:  Negative for abdominal pain, blood in stool and melena.  Genitourinary:  Negative for frequency.  Skin:  Negative for itching and rash.  Neurological:  Negative for dizziness, focal weakness, weakness and headaches.  Psychiatric/Behavioral:  The patient is nervous/anxious.     Physical Exam Blood pressure (!) 162/76, pulse 82, temperature 98.7 F (37.1 C), temperature source Oral, height 5' (1.524 m), weight 154 lb (69.9 kg), SpO2 99%. Last Weight  Most recent update: 06/21/2024  2:38 PM    Weight  69.9 kg (154 lb)             CONSTITUTIONAL: Well developed, and nourished, appropriately responsive and aware without distress.   EYES: Sclera non-icteric.   EARS, NOSE, MOUTH AND THROAT:  The oropharynx is clear. Oral mucosa is pink and moist.    Hearing is intact to voice.  NECK: Trachea is midline, and there is no jugular venous distension.  LYMPH NODES:  Lymph node in the right posterior neck is palpable. RESPIRATORY:  Normal respiratory effort without pathologic use of accessory muscles. CARDIOVASCULAR:  Well perfused.  Bilateral lower extremities with pitting edema. Symmetrical. GI: The abdomen is  soft, nontender, and nondistended. There were no palpable masses.  MUSCULOSKELETAL: Sitting upright in wheelchair, posture appears unremarkable.  Lower extremities with symmetrical edema, without calf tenderness or gross bony deformity. SKIN: Skin turgor is normal. No pathologic skin lesions appreciated, with exception of a wide area of scalp on the occipital-parietal area, that is devoid of hair, irregular like a thermal injury scar, w/o significant crusting or drainage.  Raised at perimeter and minimally pigmented.  The area is approximately 6 inches in diameter, nearly circular.  NEUROLOGIC:  Motor and  sensation appear grossly normal.  Cranial nerves are grossly without defect. PSYCH:  Alert and oriented to person, place and time. Affect is appropriate for situation.  Data Reviewed I have personally reviewed what is currently available of the patient's imaging, recent labs and medical records.   Labs:     Latest Ref Rng & Units 05/29/2024    2:29 PM 07/03/2018    1:46 PM  CBC  WBC 4.0 - 10.5 K/uL 7.4  8.9   Hemoglobin 12.0 - 15.0 g/dL 88.8  85.4   Hematocrit 36.0 - 46.0 % 32.1  41.4   Platelets 150 - 400 K/uL 143  173       Latest Ref Rng & Units 05/29/2024    2:29 PM 07/03/2018    1:46 PM  CMP  Glucose 70 - 99 mg/dL 870  616   BUN 8 - 23 mg/dL 20  16   Creatinine 9.55 - 1.00 mg/dL 9.39  9.43   Sodium 864 - 145 mmol/L 135  136   Potassium 3.5 - 5.1 mmol/L 3.5  3.8   Chloride 98 - 111 mmol/L 104  104   CO2 22 - 32 mmol/L 23  22   Calcium 8.9 - 10.3 mg/dL 9.2  9.4   Total Protein 6.5 - 8.1 g/dL 7.1  7.3   Total Bilirubin 0.0 - 1.2 mg/dL 1.0  0.8   Alkaline Phos 38 - 126 U/L 69  86   AST 15 - 41 U/L 21  20   ALT 0 - 44 U/L 29  30      Imaging: Radiological images reviewed:   Within last 24 hrs: No results found.  Assessment    Suspicious lesion of right occipital parietal scalp. Patient Active Problem List   Diagnosis Date Noted   GAD (generalized anxiety disorder) 03/28/2019    Plan    Punch biopsy of right occipital parietal scalp skin.  Pre-operative Diagnosis: Suspicious dermal lesion of the right occipital parietal scalp, possible metastatic disease.  Post-operative Diagnosis: same.    Surgeon: Honor Leghorn, M.D., FACS  Anesthesia: Local   Findings: As noted in history and physical above.  Estimated Blood Loss: 1 mL         Specimens: Punch biopsy, 4 mm from raised area at perimeter inferior posterior aspect of the lesion described.          Complications: none              Procedure Details  The patient was evaluated, the benefits,  complications, treatment options, and expected outcomes were discussed with the patient. The risks of bleeding, infection, recurrence of symptoms, failure to resolve symptoms, unanticipated injury, any of which could require further surgery were reviewed with the patient. The likelihood of improving the patient's symptoms with return to their baseline status is expected.  The patient and/or family concurred with the proposed  plan, giving informed consent.  The patient was taken to our procedure room, identified and the procedure verified.    The patient was positioned in the sitting position and the area of concern of the right posterior scalp was prepped with  Chloraprep.  A Time Out was held and the above information confirmed.  I elected to utilize the 4 mm punch biopsy to obtain 1 specimen.  11 blade was used to transected at its base, and it was placed in formalin and sent for permanent section.  Hemostasis obtained with pressure.  A dressing was applied with triple antibiotic ointment.  Instructions were given for care.    I personally spent a total of 45 minutes in the care of the patient today including preparing to see the patient, performing a medically appropriate exam/evaluation, counseling and educating, placing orders, and documenting clinical information in the EHR.   These notes generated with voice recognition software. I apologize for typographical errors.  Honor Leghorn M.D., FACS 06/22/2024, 12:14 PM

## 2024-06-25 ENCOUNTER — Other Ambulatory Visit: Payer: Self-pay | Admitting: *Deleted

## 2024-06-25 ENCOUNTER — Telehealth: Payer: Self-pay

## 2024-06-25 DIAGNOSIS — F411 Generalized anxiety disorder: Secondary | ICD-10-CM | POA: Diagnosis not present

## 2024-06-25 DIAGNOSIS — C50912 Malignant neoplasm of unspecified site of left female breast: Secondary | ICD-10-CM | POA: Diagnosis not present

## 2024-06-25 LAB — SURGICAL PATHOLOGY

## 2024-06-25 MED ORDER — HYDROCODONE-ACETAMINOPHEN 5-325 MG PO TABS: 1.0000 | ORAL_TABLET | Freq: Four times a day (QID) | ORAL | 0 refills | Status: DC | PRN

## 2024-06-25 NOTE — Telephone Encounter (Signed)
 Spoke with patient. She states that when she had the place taken off of her head she was so sick after the procedure that she was throwing up from nerves. She states that she just wanted 1 Valium to make it through her CT scan. I told her per Dr. Jacobo, that he does not like to prescribe valium. She seemed very upset about the response but understood.

## 2024-06-25 NOTE — Telephone Encounter (Signed)
 Patient very upset crying on the phone stating anxiety is worsening and she is having frequent breakdowns. She is frighten to proceed with scan.  I recommended setting up appointment with PCP for psych referral for long term treatment of anxiety, patient declined me calling PC for her. Patient is specifically asking for [place] to call her back regarding concerns.

## 2024-06-25 NOTE — Telephone Encounter (Signed)
 Patient left voicemail stating she needs a Valium sent to pharmacy for scan tomorrow and refill on narcotics. Please call her to inform her when sent in at [phone]

## 2024-06-26 ENCOUNTER — Ambulatory Visit
Admission: RE | Admit: 2024-06-26 | Discharge: 2024-06-26 | Disposition: A | Discharge status: Home / Self Care | Source: Ambulatory Visit | Attending: Oncology | Admitting: Oncology

## 2024-06-26 DIAGNOSIS — L989 Disorder of the skin and subcutaneous tissue, unspecified: Secondary | ICD-10-CM | POA: Diagnosis not present

## 2024-06-26 DIAGNOSIS — K5730 Diverticulosis of large intestine without perforation or abscess without bleeding: Secondary | ICD-10-CM | POA: Insufficient documentation

## 2024-06-26 DIAGNOSIS — I700 Atherosclerosis of aorta: Secondary | ICD-10-CM | POA: Diagnosis not present

## 2024-06-26 DIAGNOSIS — G9389 Other specified disorders of brain: Secondary | ICD-10-CM | POA: Diagnosis not present

## 2024-06-26 DIAGNOSIS — D259 Leiomyoma of uterus, unspecified: Secondary | ICD-10-CM | POA: Insufficient documentation

## 2024-06-26 DIAGNOSIS — N6320 Unspecified lump in the left breast, unspecified quadrant: Secondary | ICD-10-CM | POA: Insufficient documentation

## 2024-06-26 DIAGNOSIS — R935 Abnormal findings on diagnostic imaging of other abdominal regions, including retroperitoneum: Secondary | ICD-10-CM | POA: Diagnosis not present

## 2024-06-26 DIAGNOSIS — M899 Disorder of bone, unspecified: Secondary | ICD-10-CM | POA: Insufficient documentation

## 2024-06-26 DIAGNOSIS — I2510 Atherosclerotic heart disease of native coronary artery without angina pectoris: Secondary | ICD-10-CM | POA: Insufficient documentation

## 2024-06-26 DIAGNOSIS — R930 Abnormal findings on diagnostic imaging of skull and head, not elsewhere classified: Secondary | ICD-10-CM | POA: Insufficient documentation

## 2024-06-26 MED ORDER — IOHEXOL 300 MG/ML  SOLN
100.0000 mL | Freq: Once | INTRAMUSCULAR | Status: AC | PRN
  Administered 2024-06-26: 100 mL via INTRAVENOUS

## 2024-06-28 DIAGNOSIS — K5903 Drug induced constipation: Secondary | ICD-10-CM | POA: Diagnosis not present

## 2024-06-28 DIAGNOSIS — N63 Unspecified lump in unspecified breast: Secondary | ICD-10-CM | POA: Diagnosis not present

## 2024-06-28 DIAGNOSIS — M899 Disorder of bone, unspecified: Secondary | ICD-10-CM | POA: Diagnosis not present

## 2024-06-28 DIAGNOSIS — F411 Generalized anxiety disorder: Secondary | ICD-10-CM | POA: Diagnosis not present

## 2024-06-28 DIAGNOSIS — L989 Disorder of the skin and subcutaneous tissue, unspecified: Secondary | ICD-10-CM | POA: Diagnosis not present

## 2024-07-04 ENCOUNTER — Inpatient Hospital Stay: Attending: Oncology | Admitting: Oncology

## 2024-07-04 ENCOUNTER — Encounter: Payer: Self-pay | Admitting: Oncology

## 2024-07-04 VITALS — BP 160/78 | HR 87 | Temp 97.2°F | Resp 16 | Ht 60.0 in | Wt 154.0 lb

## 2024-07-04 DIAGNOSIS — R978 Other abnormal tumor markers: Secondary | ICD-10-CM | POA: Diagnosis not present

## 2024-07-04 DIAGNOSIS — C799 Secondary malignant neoplasm of unspecified site: Secondary | ICD-10-CM

## 2024-07-04 DIAGNOSIS — F419 Anxiety disorder, unspecified: Secondary | ICD-10-CM | POA: Diagnosis not present

## 2024-07-04 DIAGNOSIS — Z79899 Other long term (current) drug therapy: Secondary | ICD-10-CM | POA: Diagnosis not present

## 2024-07-04 DIAGNOSIS — Z800 Family history of malignant neoplasm of digestive organs: Secondary | ICD-10-CM | POA: Insufficient documentation

## 2024-07-04 DIAGNOSIS — L989 Disorder of the skin and subcutaneous tissue, unspecified: Secondary | ICD-10-CM | POA: Diagnosis not present

## 2024-07-04 DIAGNOSIS — Z1732 Human epidermal growth factor receptor 2 negative status: Secondary | ICD-10-CM | POA: Insufficient documentation

## 2024-07-04 DIAGNOSIS — Z170 Estrogen receptor positive status [ER+]: Secondary | ICD-10-CM | POA: Diagnosis not present

## 2024-07-04 DIAGNOSIS — C7951 Secondary malignant neoplasm of bone: Secondary | ICD-10-CM | POA: Insufficient documentation

## 2024-07-04 DIAGNOSIS — D649 Anemia, unspecified: Secondary | ICD-10-CM | POA: Diagnosis not present

## 2024-07-04 DIAGNOSIS — Z8041 Family history of malignant neoplasm of ovary: Secondary | ICD-10-CM | POA: Insufficient documentation

## 2024-07-04 DIAGNOSIS — Z801 Family history of malignant neoplasm of trachea, bronchus and lung: Secondary | ICD-10-CM | POA: Diagnosis not present

## 2024-07-04 DIAGNOSIS — Z1721 Progesterone receptor positive status: Secondary | ICD-10-CM | POA: Diagnosis not present

## 2024-07-04 DIAGNOSIS — D696 Thrombocytopenia, unspecified: Secondary | ICD-10-CM | POA: Insufficient documentation

## 2024-07-04 DIAGNOSIS — C50912 Malignant neoplasm of unspecified site of left female breast: Secondary | ICD-10-CM | POA: Insufficient documentation

## 2024-07-04 DIAGNOSIS — M899 Disorder of bone, unspecified: Secondary | ICD-10-CM | POA: Diagnosis not present

## 2024-07-04 NOTE — Progress Notes (Signed)
 [facility]  [phone] [phone]  ID: Melissa Armstrong OB: 04/09/1936  MR#: [id number]  RDW#:[id number]  Patient Care Team: Alla Amis, MD as PCP - General (Family Medicine) Jacobo Evalene PARAS, MD as Consulting Physician (Oncology)  CHIEF COMPLAINT: Stage IV ER/PR adenocarcinoma of the breast with metastasis to right femur and right posterior scalp.  INTERVAL HISTORY: Patient returns to clinic today for further evaluation, discussion of her pathology results, and treatment planning.  She continues to be anxious, but otherwise feels well.  Her right leg pain is unchanged. She has no neurologic complaints.  She denies any recent fevers or illnesses.  She has a good appetite and denies weight loss.  She has no chest pain, shortness of breath, cough, or hemoptysis.  She denies any nausea, vomiting, constipation, or diarrhea.  She has no melena or hematochezia.  She has no urinary complaints.  Patient offers no further specific complaints today.  REVIEW OF SYSTEMS:   Review of Systems  Constitutional: Negative.  Negative for fever, malaise/fatigue and weight loss.  Respiratory: Negative.  Negative for cough, hemoptysis and shortness of breath.   Cardiovascular: Negative.  Negative for chest pain and leg swelling.  Gastrointestinal:  Negative for abdominal pain, blood in stool and melena.  Genitourinary: Negative.  Negative for frequency.  Musculoskeletal:        Right upper leg pain.  Skin: Negative.  Negative for rash.  Neurological: Negative.  Negative for dizziness, focal weakness, weakness and headaches.  Psychiatric/Behavioral:  The patient is nervous/anxious.     As per HPI. Otherwise, a complete review of systems is negative.  PAST MEDICAL HISTORY: History reviewed. No pertinent past medical history.  PAST SURGICAL HISTORY: Past Surgical History:  Procedure Laterality Date   TONSILLECTOMY      FAMILY HISTORY: Family History   Problem Relation Age of Onset   Cancer Mother        colon cancer   Cancer Father        Lung cancer   Cancer Sister        colon cancer   Cancer Niece        ovarian cancer    ADVANCED DIRECTIVES (Y/N):  N  HEALTH MAINTENANCE: Social History   Tobacco Use   Smoking status: Never   Smokeless tobacco: Never  Substance Use Topics   Alcohol use: Never   Drug use: Never     Colonoscopy:  PAP:  Bone density:  Lipid panel:  Allergies  Allergen Reactions   Penicillin G Other (See Comments)   Trazodone Nausea Only   Venlafaxine Diarrhea and Nausea Only    Current Outpatient Medications  Medication Sig Dispense Refill   DULoxetine (CYMBALTA) 30 MG capsule Take 30 mg by mouth daily.     HYDROcodone -acetaminophen  (NORCO/VICODIN) 5-325 MG tablet Take 1 tablet by mouth every 6 (six) hours as needed for moderate pain (pain score 4-6). 30 tablet 0   diazepam  (VALIUM ) 5 MG tablet Take 5 mg by mouth once. (Patient not taking: Reported on 07/04/2024)     No current facility-administered medications for this visit.    OBJECTIVE: Vitals:   07/04/24 1343 07/04/24 1345  BP: (!) 175/75 (!) 160/78  Pulse: 87   Resp: 16   Temp: (!) 97.2 F (36.2 C)   SpO2: 98%       Body mass index is 30.08 kg/m.    ECOG FS:1 - Symptomatic but completely ambulatory  General: Well-developed, well-nourished, no acute distress. Eyes: Pink  conjunctiva, anicteric sclera. HEENT: Normocephalic, moist mucous membranes. Lungs: No audible wheezing or coughing. Heart: Regular rate and rhythm. Abdomen: Soft, nontender, no obvious distention. Musculoskeletal: No edema, cyanosis, or clubbing. Neuro: Alert, answering all questions appropriately. Cranial nerves grossly intact. Skin: No rashes or petechiae noted. Psych: Normal affect.  LAB RESULTS:  Lab Results  Component Value Date   NA 135 05/29/2024   K 3.5 05/29/2024   CL 104 05/29/2024   CO2 23 05/29/2024   GLUCOSE 129 (H) 05/29/2024   BUN 20  05/29/2024   CREATININE 0.60 05/29/2024   CALCIUM 9.2 05/29/2024   PROT 7.1 05/29/2024   ALBUMIN 4.2 05/29/2024   AST 21 05/29/2024   ALT 29 05/29/2024   ALKPHOS 69 05/29/2024   BILITOT 1.0 05/29/2024   GFRNONAA >60 05/29/2024   GFRAA >60 07/03/2018    Lab Results  Component Value Date   WBC 7.4 05/29/2024   NEUTROABS 5.6 05/29/2024   HGB 11.1 (L) 05/29/2024   HCT 32.1 (L) 05/29/2024   MCV 86.1 05/29/2024   PLT 143 (L) 05/29/2024     STUDIES: CT HEAD W & WO CONTRAST ( ) Result Date: 07/01/2024 CLINICAL DATA:  Lesion of the posterior right occipital bone EXAM: CT HEAD WITHOUT AND WITH CONTRAST TECHNIQUE: Contiguous axial images were obtained from the base of the skull through the vertex without and with intravenous contrast. RADIATION DOSE REDUCTION: This exam was performed according to the departmental dose-optimization program which includes automated exposure control, adjustment of the mA and/or kV according to patient size and/or use of iterative reconstruction technique. CONTRAST:  OMNIPAQUE  IOHEXOL  300 MG/ML  SOLN COMPARISON:  Nuclear medicine bone scan 06/12/2024. Previous head CT 07/03/2018. FINDINGS: Brain: The brain has normal appearance without evidence of malformation, atrophy, old or acute infarction, mass lesion, hemorrhage, hydrocephalus or extra-axial collection. No abnormal brain or leptomeningeal enhancement occurs. Vascular: No abnormal vascular finding. Skull: Development of erosive changes of the right posterior calvarium seen in association with enlargement of the previously noted scalp lesion. Previous scalp lesion measures 3 cm in diameter in 2019 and is now enlarged to a diameter of 8 cm. Erosive changes of the calvarium appear aggressive and are presumably due to direct invasion. Those bone changes were not present in 2019. Therefore, this is presumed represent a scalp malignancy with calvarial invasion. Although there are 1 or 2 small foci of full-thickness  bone involvement, only on postcontrast image 18 does there appear to be any minimal dural thickening. MRI could show this more accurately. Sinuses/Orbits: Clear/normal Other: None IMPRESSION: 1. Normal appearance of the brain itself. 2. Enlargement of the previously noted 3 cm right posterior scalp lesion, now measuring 8 cm in diameter. Erosive changes of the calvarium appear aggressive and are presumably due to direct invasion. Those bone changes were not present in 2019. Therefore, this is presumed to represent a scalp malignancy with calvarial invasion. Although there are 1 or 2 small foci of full-thickness bone involvement, only on postcontrast image 18 does there appear to be any minimal dural thickening. MRI could show this more accurately. Electronically Signed   By: Oneil Officer M.D.   On: 07/01/2024 14:52   CT CHEST ABDOMEN PELVIS W CONTRAST Result Date: 06/26/2024 CLINICAL DATA:  Right femur lesion suspicious for metastasis, assess for primary malignancy and other metastatic disease * Tracking Code: BO * EXAM: CT CHEST, ABDOMEN, AND PELVIS WITH CONTRAST TECHNIQUE: Multidetector CT imaging of the chest, abdomen and pelvis was performed following the standard protocol during  bolus administration of intravenous contrast. RADIATION DOSE REDUCTION: This exam was performed according to the departmental dose-optimization program which includes automated exposure control, adjustment of the mA and/or kV according to patient size and/or use of iterative reconstruction technique. CONTRAST:  OMNIPAQUE  IOHEXOL  300 MG/ML  SOLN COMPARISON:  None Available. FINDINGS: CT CHEST FINDINGS Cardiovascular: Aortic atherosclerosis. Normal heart size. Left and right coronary artery calcifications. No pericardial effusion. Mediastinum/Nodes: No enlarged mediastinal, hilar, or axillary lymph nodes. Thyroid gland, trachea, and esophagus demonstrate no significant findings. Lungs/Pleura: Mild scarring and or atelectasis. No  pleural effusion or pneumothorax. Musculoskeletal: Mass in the inferior left breast containing a central calcification measuring 1.7 x 1.2 cm (series 3, image 55). No acute osseous findings. CT ABDOMEN PELVIS FINDINGS Hepatobiliary: No solid liver abnormality is seen. No gallstones, gallbladder wall thickening, or biliary dilatation. Pancreas: Unremarkable. No pancreatic ductal dilatation or surrounding inflammatory changes. Spleen: Normal in size without significant abnormality. Adrenals/Urinary Tract: Adrenal glands are unremarkable. Kidneys are normal, without renal calculi, solid lesion, or hydronephrosis. Bladder is unremarkable. Stomach/Bowel: Stomach is within normal limits. Appendix appears normal. No evidence of bowel wall thickening, distention, or inflammatory changes. Sigmoid diverticulosis. Moderate burden of stool throughout the colon. Vascular/Lymphatic: Aortic atherosclerosis. No enlarged abdominal or pelvic lymph nodes. Reproductive: Calcified uterine fibroids. Other: No abdominal wall hernia or abnormality. No ascites. Musculoskeletal: No acute osseous findings. IMPRESSION: 1. Mass in the inferior left breast containing a central calcification measuring 1.7 x 1.2 cm. This is nonspecific and may reflect a benign fibroadenoma, however malignancy is not excluded. Correlate with mammographic evaluation. 2. No evidence of lymphadenopathy or metastatic disease in the chest, abdomen, or pelvis. 3. Sigmoid diverticulosis without evidence of acute diverticulitis. 4. Calcified uterine fibroids. 5. Coronary artery disease. Aortic Atherosclerosis (ICD10-I70.0). Electronically Signed   By: Marolyn JONETTA Jaksch M.D.   On: 06/26/2024 16:51   NM Bone Scan Whole Body Result Date: 06/12/2024 CLINICAL DATA:  Aggressive lesion in the RIGHT femur on MRI evaluation. EXAM: NUCLEAR MEDICINE WHOLE BODY BONE SCAN TECHNIQUE: Whole body anterior and posterior images were obtained approximately 3 hours after intravenous injection  of radiopharmaceutical. RADIOPHARMACEUTICALS:  20.9 mCi Technetium-64m MDP IV COMPARISON:  MRI 05/18/2019 FINDINGS: There is intense radiotracer activity within the distal RIGHT femoral diaphysis which corresponds to abnormality on comparison MRI. Additionally, there is abnormal radiotracer accumulation with posterior RIGHT calvarium in the region posterolateral RIGHT occipital bone. IMPRESSION: 1. Radiotracer activity in the distal RIGHT femur corresponds to MRI findings. 2. Second lesion in the posterior RIGHT occipital bone. Recommend MRI or CT of the head for further evaluation. Electronically Signed   By: Jackquline Boxer M.D.   On: 06/12/2024 16:01    ASSESSMENT:  Stage IV ER/PR adenocarcinoma of the breast with metastasis to right femur and right posterior scalp.  PLAN:    Stage IV ER/PR adenocarcinoma of the breast with metastasis to right femur and right posterior scalp: Confirmed by punch biopsy of the skin on her scalp on June 21, 2024.  MRI results from May 17, 2024 reviewed independently with an aggressive appearing lesion in the right femur as well as a second indeterminate lesion.  Nuclear med bone scan on June 12, 2024 corresponds with MRI findings.  CT scan results from June 26, 2024 revealed a left breast mass, but no other significant pathology.  Will get a mammogram and biopsy to confirm that this is the actual primary.  CA 27-29 is only mildly elevated at 90.2.  Referral has been sent to  radiation oncology for treatment of her scalp and femur lesions.  Once she completes XRT, will initiate patient on letrozole only.  Can consider Zometa or Xgeva in the future for her bony lesion.  Return to clinic at the end of XRT for further evaluation and initiation of letrozole.    Anemia: Patient's hemoglobin is mildly decreased 11.1.  Monitor. Thrombocytopenia: Mild, monitor. Pain: Continue Norco as prescribed.  Referral to radiation oncology as above.   Anxiety/coping: Patient was given a  referral to [facility].  He has also been placed on Cymbalta by primary care.   Patient expressed understanding and was in agreement with this plan. She also understands that She can call clinic at any time with any questions, concerns, or complaints.    Cancer Staging  Invasive ductal carcinoma of breast, female, left [facility]) Staging form: Breast, AJCC 8th Edition - Clinical stage from 07/04/2024: Stage IV (cT1c, cN0, pM1, ER+, PR+, HER2-) - Signed by Jacobo Evalene PARAS, MD on 07/04/2024 Stage prefix: Initial diagnosis   Evalene PARAS Jacobo, MD   07/04/2024 3:11 PM

## 2024-07-06 ENCOUNTER — Other Ambulatory Visit: Payer: Self-pay | Admitting: *Deleted

## 2024-07-06 ENCOUNTER — Ambulatory Visit
Admission: RE | Admit: 2024-07-06 | Discharge: 2024-07-06 | Disposition: A | Discharge status: Home / Self Care | Source: Ambulatory Visit | Attending: Oncology | Admitting: Oncology

## 2024-07-06 ENCOUNTER — Telehealth: Payer: Self-pay | Admitting: *Deleted

## 2024-07-06 DIAGNOSIS — C799 Secondary malignant neoplasm of unspecified site: Secondary | ICD-10-CM | POA: Diagnosis present

## 2024-07-06 DIAGNOSIS — N6321 Unspecified lump in the left breast, upper outer quadrant: Secondary | ICD-10-CM | POA: Diagnosis not present

## 2024-07-06 DIAGNOSIS — C7951 Secondary malignant neoplasm of bone: Secondary | ICD-10-CM | POA: Diagnosis not present

## 2024-07-06 DIAGNOSIS — C7981 Secondary malignant neoplasm of breast: Secondary | ICD-10-CM | POA: Diagnosis not present

## 2024-07-06 DIAGNOSIS — N6323 Unspecified lump in the left breast, lower outer quadrant: Secondary | ICD-10-CM | POA: Insufficient documentation

## 2024-07-06 DIAGNOSIS — C50912 Malignant neoplasm of unspecified site of left female breast: Secondary | ICD-10-CM | POA: Diagnosis not present

## 2024-07-06 DIAGNOSIS — R92333 Mammographic heterogeneous density, bilateral breasts: Secondary | ICD-10-CM | POA: Diagnosis not present

## 2024-07-06 DIAGNOSIS — R921 Mammographic calcification found on diagnostic imaging of breast: Secondary | ICD-10-CM | POA: Diagnosis not present

## 2024-07-06 MED ORDER — HYDROCODONE-ACETAMINOPHEN 5-325 MG PO TABS: ORAL_TABLET | ORAL | 0 refills | Status: DC

## 2024-07-06 NOTE — Telephone Encounter (Signed)
 Patient called again today to make sure that we get a refill of her hydrocodone  and I did talk to Dr. Jacobo and he now put her in for she can take it every 4-6 hours and we gave her 60 pills and it went to the pharmacy it is ready to be picked up but I have tried now 2 times to call her and when I call I cannot leave a message with her phone right now I guess she has too many messages need to clean them out

## 2024-07-06 NOTE — Telephone Encounter (Signed)
 Patient called and said she needs to get a new refill for her pain medicine of hydrocodone .  Per Dr. Jacobo she has very bad pain and he wants her to have it for every 4-6 hours for her pain medicine with 60 pills

## 2024-07-09 ENCOUNTER — Other Ambulatory Visit: Payer: Self-pay | Admitting: Oncology

## 2024-07-09 DIAGNOSIS — R928 Other abnormal and inconclusive findings on diagnostic imaging of breast: Secondary | ICD-10-CM

## 2024-07-10 ENCOUNTER — Telehealth: Payer: Self-pay | Admitting: *Deleted

## 2024-07-10 ENCOUNTER — Other Ambulatory Visit: Payer: Self-pay | Admitting: *Deleted

## 2024-07-10 ENCOUNTER — Ambulatory Visit: Admitting: Radiation Oncology

## 2024-07-10 MED ORDER — GLYCERIN (ADULT) 2 G RE SUPP: 1.0000 | RECTAL | 0 refills | Status: DC | PRN

## 2024-07-10 NOTE — Telephone Encounter (Signed)
 The patient called and said that she is very constipated and she is been taking 2 pills of Senokot every day and MiraLAX and she says she has done the MiraLAX twice in 1 day nothing is helping, she wants to know if you can send a suppository for constipation

## 2024-07-10 NOTE — Telephone Encounter (Signed)
 The patient called back and asked if the suppository was sent to the pharmacy.  I called her and she did not answer but I left a voicemail that yes it was at the pharmacy ready to pick up

## 2024-07-11 ENCOUNTER — Institutional Professional Consult (permissible substitution): Admitting: Radiation Oncology

## 2024-07-17 ENCOUNTER — Ambulatory Visit
Admission: RE | Admit: 2024-07-17 | Discharge: 2024-07-17 | Disposition: A | Discharge status: Home / Self Care | Source: Ambulatory Visit | Attending: Oncology | Admitting: Oncology

## 2024-07-17 ENCOUNTER — Institutional Professional Consult (permissible substitution): Admitting: Radiation Oncology

## 2024-07-17 DIAGNOSIS — N6032 Fibrosclerosis of left breast: Secondary | ICD-10-CM | POA: Diagnosis not present

## 2024-07-17 DIAGNOSIS — R928 Other abnormal and inconclusive findings on diagnostic imaging of breast: Secondary | ICD-10-CM | POA: Insufficient documentation

## 2024-07-17 DIAGNOSIS — N6022 Fibroadenosis of left breast: Secondary | ICD-10-CM | POA: Insufficient documentation

## 2024-07-17 DIAGNOSIS — N6324 Unspecified lump in the left breast, lower inner quadrant: Secondary | ICD-10-CM | POA: Diagnosis not present

## 2024-07-17 DIAGNOSIS — R920 Mammographic microcalcification found on diagnostic imaging of breast: Secondary | ICD-10-CM | POA: Diagnosis not present

## 2024-07-17 HISTORY — PX: BREAST BIOPSY: SHX20

## 2024-07-17 MED ORDER — LIDOCAINE-EPINEPHRINE 1 %-1:100000 IJ SOLN
20.0000 mL | Freq: Once | INTRAMUSCULAR | Status: AC
  Administered 2024-07-17: 20 mL

## 2024-07-17 MED ORDER — LIDOCAINE 1 % OPTIME INJ - NO CHARGE
2.0000 mL | Freq: Once | INTRAMUSCULAR | Status: AC
  Administered 2024-07-17: 2 mL via INTRADERMAL
  Filled 2024-07-17: qty 2

## 2024-07-17 MED ORDER — LIDOCAINE-EPINEPHRINE 1 %-1:100000 IJ SOLN
8.0000 mL | Freq: Once | INTRAMUSCULAR | Status: AC
  Administered 2024-07-17: 8 mL

## 2024-07-17 MED ORDER — LIDOCAINE 1 % OPTIME INJ - NO CHARGE
5.0000 mL | Freq: Once | INTRAMUSCULAR | Status: AC
  Administered 2024-07-17: 5 mL
  Filled 2024-07-17: qty 6

## 2024-07-18 LAB — SURGICAL PATHOLOGY

## 2024-07-24 ENCOUNTER — Encounter: Payer: Self-pay | Admitting: Radiation Oncology

## 2024-07-24 ENCOUNTER — Ambulatory Visit
Admission: RE | Admit: 2024-07-24 | Discharge: 2024-07-24 | Disposition: A | Discharge status: Home / Self Care | Source: Ambulatory Visit | Attending: Radiation Oncology | Admitting: Radiation Oncology

## 2024-07-24 VITALS — BP 162/77 | HR 94 | Temp 97.5°F | Resp 20

## 2024-07-24 DIAGNOSIS — C801 Malignant (primary) neoplasm, unspecified: Secondary | ICD-10-CM | POA: Diagnosis not present

## 2024-07-24 DIAGNOSIS — C792 Secondary malignant neoplasm of skin: Secondary | ICD-10-CM | POA: Diagnosis not present

## 2024-07-24 DIAGNOSIS — Z170 Estrogen receptor positive status [ER+]: Secondary | ICD-10-CM | POA: Diagnosis not present

## 2024-07-24 DIAGNOSIS — C7951 Secondary malignant neoplasm of bone: Secondary | ICD-10-CM | POA: Insufficient documentation

## 2024-07-24 DIAGNOSIS — C50912 Malignant neoplasm of unspecified site of left female breast: Secondary | ICD-10-CM | POA: Diagnosis not present

## 2024-07-24 DIAGNOSIS — C799 Secondary malignant neoplasm of unspecified site: Secondary | ICD-10-CM

## 2024-07-24 NOTE — Consult Note (Signed)
 NEW PATIENT EVALUATION  Name: Melissa Armstrong  MRN: [id number]  Date:   07/24/2024     DOB: 1936/10/21   This 88 y.o. female patient presents to the clinic for initial evaluation of probable stage IV breast cancer with scalp and calvarium metastasis as well as distal right femur.  REFERRING PHYSICIAN: Alla Amis, MD  CHIEF COMPLAINT:  Chief Complaint  Patient presents with   metastatic adnocarcinoma    DIAGNOSIS: The encounter diagnosis was Metastatic adenocarcinoma (HCC).   PREVIOUS INVESTIGATIONS:  Bone scan and CT scans reviewed mammogram reviewed Pathology reports reviewed Clinical notes reviewed  HPI: Patient is a 88 year old female who has had a lesion on her scalp for several years.  She developed right leg pain had an MRI scan of her right leg showing a well-circumscribed lesion in the right distal femur suspicious for malignancy.  She also had a large lesion on her scalp which she thought was a nevus although on CT scan of her head previously noted 3 cm right posterior scalp lesion had grown to 8 cm in diameter with erosive changes of the calvarium and appearing aggressive and presumed be direct invasion of metastatic disease.  CT scan showed a mass in the inferior left breast containing a central calcified mass measuring 1.7 x 1.2 cm.  Bone scan also demonstrated areas of increased radiotracer activity in the distal right femur as well as the scalp consistent with known metastatic sites of disease.  She had a biopsy of her breast which was nonmalignant.  Biopsy of her scalp lesion showed metastatic adenocarcinoma ER positive.  The histologic and immunoperoxidase findings were most consistent with breast primary.  She is seen today for consideration of palliative treatment to both her scalp and distal right femur.  She is in a wheelchair now not ambulating well.  Lesion on her scalp is nonpainful.  PLANNED TREATMENT REGIMEN: Palliative radiation therapy to scalp as well as  right distal femur  PAST MEDICAL HISTORY:  has no past medical history on file.    PAST SURGICAL HISTORY:  Past Surgical History:  Procedure Laterality Date   BREAST BIOPSY Left 07/17/2024   stereo bx, x clip, path pending   BREAST BIOPSY Left 07/17/2024   US  LT BREAST BX W LOC DEV 1ST LESION IMG BX SPEC US  GUIDE 07/17/2024 [facility]   BREAST BIOPSY Left 07/17/2024   MM LT BREAST BX W LOC DEV 1ST LESION IMAGE BX SPEC STEREO GUIDE 07/17/2024 [facility]   TONSILLECTOMY      FAMILY HISTORY: family history includes Cancer in her father, mother, niece, and sister.  SOCIAL HISTORY:  reports that she has never smoked. She has never used smokeless tobacco. She reports that she does not drink alcohol and does not use drugs.  ALLERGIES: Penicillin g, Trazodone, and Venlafaxine  MEDICATIONS:  Current Outpatient Medications  Medication Sig Dispense Refill   diazepam  (VALIUM ) 5 MG tablet Take 5 mg by mouth once.     DULoxetine (CYMBALTA) 30 MG capsule Take 30 mg by mouth daily.     glycerin  adult 2 g suppository Place 1 suppository rectally as needed for constipation. 12 suppository 0   HYDROcodone -acetaminophen  (NORCO/VICODIN) 5-325 MG tablet Take  1 tablet every 4-6 hours as needed for pain 60 tablet 0   No current facility-administered medications for this encounter.    ECOG PERFORMANCE STATUS: Wheelchair confined  REVIEW OF SYSTEMS:  Patient denies any weight loss, fatigue, weakness, fever, chills or night sweats. Patient denies any loss of vision,  blurred vision. Patient denies any ringing  of the ears or hearing loss. No irregular heartbeat. Patient denies heart murmur or history of fainting. Patient denies any chest pain or pain radiating to her upper extremities. Patient denies any shortness of breath, difficulty breathing at night, cough or hemoptysis. Patient denies any swelling in the lower legs. Patient denies any nausea vomiting, vomiting of blood, or coffee ground  material in the vomitus. Patient denies any stomach pain. Patient states has had normal bowel movements no significant constipation or diarrhea. Patient denies any dysuria, hematuria or significant nocturia. Patient denies any problems walking, swelling in the joints or loss of balance. Patient denies any skin changes, loss of hair or loss of weight. Patient denies any excessive worrying or anxiety or significant depression. Patient denies any problems with insomnia. Patient denies excessive thirst, polyuria, polydipsia. Patient denies any swollen glands, patient denies easy bruising or easy bleeding. Patient denies any recent infections, allergies or URI. Patient s visual fields have not changed significantly in recent time.   PHYSICAL EXAM: BP (!) 162/77 Comment: Patient states normal at home, white coat syndrom  Pulse 94   Temp (!) 97.5 F (36.4 C) (Tympanic)   Resp 20  Wheelchair-bound female in NAD.  There is some pain on direct pressure of her distal right femur.  Direct provide she has a large invasive looking scalp lesion nontender to the touch with no areas of ulceration.  Well-developed well-nourished patient in NAD. HEENT reveals PERLA, EOMI, discs not visualized.  Oral cavity is clear. No oral mucosal lesions are identified. Neck is clear without evidence of cervical or supraclavicular adenopathy. Lungs are clear to A&P. Cardiac examination is essentially unremarkable with regular rate and rhythm without murmur rub or thrill. Abdomen is benign with no organomegaly or masses noted. Motor sensory and DTR levels are equal and symmetric in the upper and lower extremities. Cranial nerves II through XII are grossly intact. Proprioception is intact. No peripheral adenopathy or edema is identified. No motor or sensory levels are noted. Crude visual fields are within normal range.  LABORATORY DATA: Pathology reports reviewed    RADIOLOGY RESULTS: CT scans bone scan mammograms all reviewed compatible  with above-stated findings   IMPRESSION: Stage IV breast cancer with both scalp as well as right distal femur involvement in a 88 year old female ER positive  PLAN: At this time it is good with a short palliative course of radiation therapy to both sites including the scalp as well as the right distal femur.  Would plan on delivering 30 Gray in 10 fractions to both sites and evaluating for response.  Risks and benefits of treatment occluding hair loss in the scalp lesion skin reaction fatigue alteration of blood counts all were reviewed in detail with the patient.  She comprehends my treatment plan well.  I have personally 7 ordered CT simulation.  I have discussed the case personally with medical oncology who will institute letrozole treatment after completion of radiation.  Patient comprehends my recommendations well.  I would like to take this opportunity to thank you for allowing me to participate in the care of your patient.SABRA Marcey Penton, MD

## 2024-07-26 ENCOUNTER — Ambulatory Visit
Admission: RE | Admit: 2024-07-26 | Discharge: 2024-07-26 | Disposition: A | Discharge status: Home / Self Care | Source: Ambulatory Visit | Attending: Radiation Oncology | Admitting: Radiation Oncology

## 2024-07-26 DIAGNOSIS — C7951 Secondary malignant neoplasm of bone: Secondary | ICD-10-CM | POA: Diagnosis not present

## 2024-07-26 DIAGNOSIS — C801 Malignant (primary) neoplasm, unspecified: Secondary | ICD-10-CM | POA: Diagnosis not present

## 2024-07-30 DIAGNOSIS — Z170 Estrogen receptor positive status [ER+]: Secondary | ICD-10-CM | POA: Insufficient documentation

## 2024-07-30 DIAGNOSIS — C50912 Malignant neoplasm of unspecified site of left female breast: Secondary | ICD-10-CM | POA: Diagnosis not present

## 2024-07-30 DIAGNOSIS — C7951 Secondary malignant neoplasm of bone: Secondary | ICD-10-CM | POA: Insufficient documentation

## 2024-07-30 DIAGNOSIS — C792 Secondary malignant neoplasm of skin: Secondary | ICD-10-CM | POA: Insufficient documentation

## 2024-07-30 DIAGNOSIS — C801 Malignant (primary) neoplasm, unspecified: Secondary | ICD-10-CM | POA: Diagnosis not present

## 2024-08-02 ENCOUNTER — Other Ambulatory Visit: Payer: Self-pay

## 2024-08-02 ENCOUNTER — Ambulatory Visit
Admission: RE | Admit: 2024-08-02 | Discharge: 2024-08-02 | Disposition: A | Discharge status: Home / Self Care | Source: Ambulatory Visit | Attending: Radiation Oncology | Admitting: Radiation Oncology

## 2024-08-02 DIAGNOSIS — C7951 Secondary malignant neoplasm of bone: Secondary | ICD-10-CM | POA: Diagnosis not present

## 2024-08-02 DIAGNOSIS — C801 Malignant (primary) neoplasm, unspecified: Secondary | ICD-10-CM | POA: Diagnosis not present

## 2024-08-02 LAB — RAD ONC ARIA SESSION SUMMARY

## 2024-08-03 ENCOUNTER — Ambulatory Visit
Admission: RE | Admit: 2024-08-03 | Discharge: 2024-08-03 | Disposition: A | Discharge status: Home / Self Care | Source: Ambulatory Visit | Attending: Radiation Oncology | Admitting: Radiation Oncology

## 2024-08-03 ENCOUNTER — Other Ambulatory Visit: Payer: Self-pay

## 2024-08-03 DIAGNOSIS — Z510 Encounter for antineoplastic radiation therapy: Secondary | ICD-10-CM | POA: Diagnosis not present

## 2024-08-03 DIAGNOSIS — C801 Malignant (primary) neoplasm, unspecified: Secondary | ICD-10-CM | POA: Diagnosis not present

## 2024-08-03 DIAGNOSIS — C7951 Secondary malignant neoplasm of bone: Secondary | ICD-10-CM | POA: Diagnosis not present

## 2024-08-03 LAB — RAD ONC ARIA SESSION SUMMARY

## 2024-08-06 ENCOUNTER — Ambulatory Visit
Admission: RE | Admit: 2024-08-06 | Discharge: 2024-08-06 | Disposition: A | Discharge status: Home / Self Care | Source: Ambulatory Visit | Attending: Radiation Oncology | Admitting: Radiation Oncology

## 2024-08-06 ENCOUNTER — Other Ambulatory Visit: Payer: Self-pay

## 2024-08-06 DIAGNOSIS — C7951 Secondary malignant neoplasm of bone: Secondary | ICD-10-CM | POA: Diagnosis not present

## 2024-08-06 DIAGNOSIS — C801 Malignant (primary) neoplasm, unspecified: Secondary | ICD-10-CM | POA: Diagnosis not present

## 2024-08-06 DIAGNOSIS — Z510 Encounter for antineoplastic radiation therapy: Secondary | ICD-10-CM | POA: Diagnosis not present

## 2024-08-06 LAB — RAD ONC ARIA SESSION SUMMARY
Course Elapsed Days: 4
Plan Fractions Treated to Date: 3
Plan Fractions Treated to Date: 3
Plan Prescribed Dose Per Fraction: 3 Gy
Plan Prescribed Dose Per Fraction: 3 Gy
Plan Total Fractions Prescribed: 10
Plan Total Fractions Prescribed: 10
Plan Total Prescribed Dose: 30 Gy
Plan Total Prescribed Dose: 30 Gy
Reference Point Dosage Given to Date: 9 Gy
Reference Point Dosage Given to Date: 9 Gy
Reference Point Session Dosage Given: 3 Gy
Reference Point Session Dosage Given: 3 Gy
Session Number: 3

## 2024-08-07 ENCOUNTER — Other Ambulatory Visit: Payer: Self-pay

## 2024-08-07 ENCOUNTER — Ambulatory Visit
Admission: RE | Admit: 2024-08-07 | Discharge: 2024-08-07 | Disposition: A | Discharge status: Home / Self Care | Source: Ambulatory Visit | Attending: Radiation Oncology | Admitting: Radiation Oncology

## 2024-08-07 DIAGNOSIS — C7951 Secondary malignant neoplasm of bone: Secondary | ICD-10-CM | POA: Diagnosis not present

## 2024-08-07 DIAGNOSIS — C801 Malignant (primary) neoplasm, unspecified: Secondary | ICD-10-CM | POA: Diagnosis not present

## 2024-08-07 DIAGNOSIS — Z510 Encounter for antineoplastic radiation therapy: Secondary | ICD-10-CM | POA: Diagnosis not present

## 2024-08-07 LAB — RAD ONC ARIA SESSION SUMMARY

## 2024-08-08 ENCOUNTER — Ambulatory Visit
Admission: RE | Admit: 2024-08-08 | Discharge: 2024-08-08 | Disposition: A | Discharge status: Home / Self Care | Source: Ambulatory Visit | Attending: Radiation Oncology | Admitting: Radiation Oncology

## 2024-08-08 ENCOUNTER — Other Ambulatory Visit: Payer: Self-pay

## 2024-08-08 DIAGNOSIS — C801 Malignant (primary) neoplasm, unspecified: Secondary | ICD-10-CM | POA: Diagnosis not present

## 2024-08-08 DIAGNOSIS — C7951 Secondary malignant neoplasm of bone: Secondary | ICD-10-CM | POA: Diagnosis not present

## 2024-08-08 DIAGNOSIS — Z510 Encounter for antineoplastic radiation therapy: Secondary | ICD-10-CM | POA: Diagnosis not present

## 2024-08-08 LAB — RAD ONC ARIA SESSION SUMMARY

## 2024-08-09 ENCOUNTER — Other Ambulatory Visit: Payer: Self-pay

## 2024-08-09 ENCOUNTER — Ambulatory Visit
Admission: RE | Admit: 2024-08-09 | Discharge: 2024-08-09 | Disposition: A | Discharge status: Home / Self Care | Source: Ambulatory Visit | Attending: Radiation Oncology | Admitting: Radiation Oncology

## 2024-08-09 DIAGNOSIS — C7951 Secondary malignant neoplasm of bone: Secondary | ICD-10-CM | POA: Diagnosis not present

## 2024-08-09 DIAGNOSIS — Z510 Encounter for antineoplastic radiation therapy: Secondary | ICD-10-CM | POA: Diagnosis not present

## 2024-08-09 DIAGNOSIS — C801 Malignant (primary) neoplasm, unspecified: Secondary | ICD-10-CM | POA: Diagnosis not present

## 2024-08-09 LAB — RAD ONC ARIA SESSION SUMMARY

## 2024-08-10 ENCOUNTER — Other Ambulatory Visit: Payer: Self-pay | Admitting: *Deleted

## 2024-08-10 ENCOUNTER — Other Ambulatory Visit: Payer: Self-pay

## 2024-08-10 ENCOUNTER — Ambulatory Visit
Admission: RE | Admit: 2024-08-10 | Discharge: 2024-08-10 | Disposition: A | Discharge status: Home / Self Care | Source: Ambulatory Visit | Attending: Radiation Oncology | Admitting: Radiation Oncology

## 2024-08-10 DIAGNOSIS — C7951 Secondary malignant neoplasm of bone: Secondary | ICD-10-CM | POA: Diagnosis not present

## 2024-08-10 DIAGNOSIS — Z510 Encounter for antineoplastic radiation therapy: Secondary | ICD-10-CM | POA: Diagnosis not present

## 2024-08-10 DIAGNOSIS — C801 Malignant (primary) neoplasm, unspecified: Secondary | ICD-10-CM | POA: Diagnosis not present

## 2024-08-10 LAB — RAD ONC ARIA SESSION SUMMARY

## 2024-08-10 MED ORDER — ONDANSETRON HCL 4 MG PO TABS: 4.0000 mg | ORAL_TABLET | Freq: Three times a day (TID) | ORAL | 1 refills | Status: DC | PRN

## 2024-08-13 ENCOUNTER — Other Ambulatory Visit: Payer: Self-pay

## 2024-08-13 ENCOUNTER — Ambulatory Visit
Admission: RE | Admit: 2024-08-13 | Discharge: 2024-08-13 | Disposition: A | Discharge status: Home / Self Care | Source: Ambulatory Visit | Attending: Radiation Oncology | Admitting: Radiation Oncology

## 2024-08-13 DIAGNOSIS — Z510 Encounter for antineoplastic radiation therapy: Secondary | ICD-10-CM | POA: Diagnosis not present

## 2024-08-13 DIAGNOSIS — C801 Malignant (primary) neoplasm, unspecified: Secondary | ICD-10-CM | POA: Diagnosis not present

## 2024-08-13 DIAGNOSIS — C7951 Secondary malignant neoplasm of bone: Secondary | ICD-10-CM | POA: Diagnosis not present

## 2024-08-13 LAB — RAD ONC ARIA SESSION SUMMARY
Course Elapsed Days: 11
Plan Fractions Treated to Date: 8
Plan Fractions Treated to Date: 8
Plan Prescribed Dose Per Fraction: 3 Gy
Plan Prescribed Dose Per Fraction: 3 Gy
Plan Total Fractions Prescribed: 10
Plan Total Fractions Prescribed: 10
Plan Total Prescribed Dose: 30 Gy
Plan Total Prescribed Dose: 30 Gy
Reference Point Dosage Given to Date: 24 Gy
Reference Point Dosage Given to Date: 24 Gy
Reference Point Session Dosage Given: 3 Gy
Reference Point Session Dosage Given: 3 Gy
Session Number: 8

## 2024-08-14 ENCOUNTER — Other Ambulatory Visit: Payer: Self-pay

## 2024-08-14 ENCOUNTER — Ambulatory Visit
Admission: RE | Admit: 2024-08-14 | Discharge: 2024-08-14 | Disposition: A | Discharge status: Home / Self Care | Source: Ambulatory Visit | Attending: Radiation Oncology | Admitting: Radiation Oncology

## 2024-08-14 ENCOUNTER — Inpatient Hospital Stay (HOSPITAL_BASED_OUTPATIENT_CLINIC_OR_DEPARTMENT_OTHER): Admitting: Oncology

## 2024-08-14 ENCOUNTER — Encounter: Payer: Self-pay | Admitting: Oncology

## 2024-08-14 VITALS — BP 143/67 | HR 85 | Temp 97.6°F | Resp 18 | Ht 60.0 in

## 2024-08-14 DIAGNOSIS — Z801 Family history of malignant neoplasm of trachea, bronchus and lung: Secondary | ICD-10-CM | POA: Insufficient documentation

## 2024-08-14 DIAGNOSIS — D696 Thrombocytopenia, unspecified: Secondary | ICD-10-CM | POA: Insufficient documentation

## 2024-08-14 DIAGNOSIS — C50919 Malignant neoplasm of unspecified site of unspecified female breast: Secondary | ICD-10-CM | POA: Insufficient documentation

## 2024-08-14 DIAGNOSIS — C792 Secondary malignant neoplasm of skin: Secondary | ICD-10-CM | POA: Insufficient documentation

## 2024-08-14 DIAGNOSIS — Z8041 Family history of malignant neoplasm of ovary: Secondary | ICD-10-CM | POA: Insufficient documentation

## 2024-08-14 DIAGNOSIS — F419 Anxiety disorder, unspecified: Secondary | ICD-10-CM | POA: Insufficient documentation

## 2024-08-14 DIAGNOSIS — D649 Anemia, unspecified: Secondary | ICD-10-CM | POA: Insufficient documentation

## 2024-08-14 DIAGNOSIS — Z510 Encounter for antineoplastic radiation therapy: Secondary | ICD-10-CM | POA: Diagnosis not present

## 2024-08-14 DIAGNOSIS — Z923 Personal history of irradiation: Secondary | ICD-10-CM | POA: Insufficient documentation

## 2024-08-14 DIAGNOSIS — C50912 Malignant neoplasm of unspecified site of left female breast: Secondary | ICD-10-CM

## 2024-08-14 DIAGNOSIS — Z800 Family history of malignant neoplasm of digestive organs: Secondary | ICD-10-CM | POA: Insufficient documentation

## 2024-08-14 DIAGNOSIS — C7951 Secondary malignant neoplasm of bone: Secondary | ICD-10-CM | POA: Insufficient documentation

## 2024-08-14 DIAGNOSIS — Z79899 Other long term (current) drug therapy: Secondary | ICD-10-CM | POA: Insufficient documentation

## 2024-08-14 DIAGNOSIS — C801 Malignant (primary) neoplasm, unspecified: Secondary | ICD-10-CM | POA: Diagnosis not present

## 2024-08-14 DIAGNOSIS — C799 Secondary malignant neoplasm of unspecified site: Secondary | ICD-10-CM

## 2024-08-14 LAB — RAD ONC ARIA SESSION SUMMARY

## 2024-08-14 MED ORDER — LETROZOLE 2.5 MG PO TABS: 2.5000 mg | ORAL_TABLET | Freq: Every day | ORAL | 3 refills | Status: DC

## 2024-08-14 NOTE — Progress Notes (Unsigned)
 Patient is doing ok, she had a MM on 07/17/2024. No new symptoms to report today.

## 2024-08-14 NOTE — Progress Notes (Unsigned)
 [facility]  [phone] [phone]  ID: Melissa Armstrong OB: [birth year]  MR#: [id number]  RDW#:[id number]  Patient Care Team: Alla Amis, MD as PCP - General (Family Medicine) Jacobo Evalene PARAS, MD as Consulting Physician (Oncology)  CHIEF COMPLAINT: Stage IV ER/PR adenocarcinoma of the breast with metastasis to right femur and right posterior scalp.  INTERVAL HISTORY: Patient returns to clinic today for further evaluation, discussion of her pathology results, and treatment planning.  She continues to be anxious, but otherwise feels well.  Her right leg pain is unchanged. She has no neurologic complaints.  She denies any recent fevers or illnesses.  She has a good appetite and denies weight loss.  She has no chest pain, shortness of breath, cough, or hemoptysis.  She denies any nausea, vomiting, constipation, or diarrhea.  She has no melena or hematochezia.  She has no urinary complaints.  Patient offers no further specific complaints today.  REVIEW OF SYSTEMS:   Review of Systems  Constitutional: Negative.  Negative for fever, malaise/fatigue and weight loss.  Respiratory: Negative.  Negative for cough, hemoptysis and shortness of breath.   Cardiovascular: Negative.  Negative for chest pain and leg swelling.  Gastrointestinal:  Negative for abdominal pain, blood in stool and melena.  Genitourinary: Negative.  Negative for frequency.  Musculoskeletal:        Right upper leg pain.  Skin: Negative.  Negative for rash.  Neurological: Negative.  Negative for dizziness, focal weakness, weakness and headaches.  Psychiatric/Behavioral:  The patient is nervous/anxious.     As per HPI. Otherwise, a complete review of systems is negative.  PAST MEDICAL HISTORY: History reviewed. No pertinent past medical history.  PAST SURGICAL HISTORY: Past Surgical History:  Procedure Laterality Date  . BREAST BIOPSY Left 07/17/2024   stereo bx, x clip, path  pending  . BREAST BIOPSY Left 07/17/2024   US  LT BREAST BX W LOC DEV 1ST LESION IMG BX SPEC US  GUIDE 07/17/2024 [facility]  . BREAST BIOPSY Left 07/17/2024   MM LT BREAST BX W LOC DEV 1ST LESION IMAGE BX SPEC STEREO GUIDE 07/17/2024 [facility]  . TONSILLECTOMY      FAMILY HISTORY: Family History  Problem Relation Age of Onset  . Cancer Mother        colon cancer  . Cancer Father        Lung cancer  . Cancer Sister        colon cancer  . Cancer Niece        ovarian cancer    ADVANCED DIRECTIVES (Y/N):  N  HEALTH MAINTENANCE: Social History   Tobacco Use  . Smoking status: Never  . Smokeless tobacco: Never  Substance Use Topics  . Alcohol use: Never  . Drug use: Never     Colonoscopy:  PAP:  Bone density:  Lipid panel:  Allergies  Allergen Reactions  . Penicillin G Other (See Comments)  . Trazodone Nausea Only  . Venlafaxine Diarrhea and Nausea Only    Current Outpatient Medications  Medication Sig Dispense Refill  . diazepam  (VALIUM ) 5 MG tablet Take 5 mg by mouth once.    . DULoxetine (CYMBALTA) 30 MG capsule Take 30 mg by mouth daily.    . glycerin  adult 2 g suppository Place 1 suppository rectally as needed for constipation. 12 suppository 0  . HYDROcodone -acetaminophen  (NORCO/VICODIN) 5-325 MG tablet Take  1 tablet every 4-6 hours as needed for pain 60 tablet 0  . LORazepam (ATIVAN) 0.5 MG tablet 1-2  tabs 1 hour prior to procedure    . ondansetron  (ZOFRAN ) 4 MG tablet Take 1 tablet (4 mg total) by mouth every 8 (eight) hours as needed for nausea or vomiting. 30 tablet 1   No current facility-administered medications for this visit.    OBJECTIVE: Vitals:   08/14/24 1110  BP: (!) 143/67  Pulse: 85  Resp: 18  Temp: 97.6 F (36.4 C)  SpO2: 96%      Body mass index is 30.08 kg/m.    ECOG FS:1 - Symptomatic but completely ambulatory  General: Well-developed, well-nourished, no acute distress. Eyes: Pink conjunctiva, anicteric  sclera. HEENT: Normocephalic, moist mucous membranes. Lungs: No audible wheezing or coughing. Heart: Regular rate and rhythm. Abdomen: Soft, nontender, no obvious distention. Musculoskeletal: No edema, cyanosis, or clubbing. Neuro: Alert, answering all questions appropriately. Cranial nerves grossly intact. Skin: No rashes or petechiae noted. Psych: Normal affect.  LAB RESULTS:  Lab Results  Component Value Date   NA 135 05/29/2024   K 3.5 05/29/2024   CL 104 05/29/2024   CO2 23 05/29/2024   GLUCOSE 129 (H) 05/29/2024   BUN 20 05/29/2024   CREATININE 0.60 05/29/2024   CALCIUM 9.2 05/29/2024   PROT 7.1 05/29/2024   ALBUMIN 4.2 05/29/2024   AST 21 05/29/2024   ALT 29 05/29/2024   ALKPHOS 69 05/29/2024   BILITOT 1.0 05/29/2024   GFRNONAA >60 05/29/2024   GFRAA >60 07/03/2018    Lab Results  Component Value Date   WBC 7.4 05/29/2024   NEUTROABS 5.6 05/29/2024   HGB 11.1 (L) 05/29/2024   HCT 32.1 (L) 05/29/2024   MCV 86.1 05/29/2024   PLT 143 (L) 05/29/2024     STUDIES: US  LT BREAST BX W LOC DEV 1ST LESION IMG BX SPEC US  GUIDE Addendum Date: 07/18/2024 ADDENDUM REPORT: 07/18/2024 12:40 ADDENDUM: PATHOLOGY revealed: Site 1. Breast, left, needle core biopsy, 7 o'clock, 3 cmfn (ribbon clip) : - BENIGN BREAST SHOWING EXTENSIVE STROMAL FIBROSIS WITH FOCAL ADENOSIS - NEGATIVE FOR MICROCALCIFICATIONS - NEGATIVE FOR ATYPIA AND CARCINOMA. Pathology results are CONCORDANT with imaging findings, per Dirk Arrant M.D. PATHOLOGY revealed: Site 2. Breast, left, needle core biopsy, upper outer posterior depth (x clip) : - BENIGN BREAST SHOWING STROMAL FIBROSIS AND FIBROMATOID CHANGE. MICROCALCIFICATIONS PRESENT WITHIN FIBROMATOID STROMA - NEGATIVE FOR ATYPIA AND CARCINOMA. Pathology results are CONCORDANT with imaging findings, per Dirk Arrant M.D. Pathology results and recommendations below were discussed with patient by telephone on 07/18/2024 by Rock Hover RN. Patient reported  biopsy site within normal limits with slight tenderness at the site. Post biopsy care instructions were reviewed, questions were answered and my direct phone number was provided to patient. Patient was instructed to call [facility] if any concerns or questions arise related to the biopsy. Recommendation: Patient instructed to follow-up with provider for subsequent or persistent concerns. If further imaging of the breast is warranted to evaluate for occult malignancy, dedicated breast MRI with and without contrast would be recommended. Pathology results reported by Rock Hover RN on 07/18/2024. Electronically Signed   By: Dirk Arrant M.D.   On: 07/18/2024 12:40   Result Date: 07/18/2024 CLINICAL DATA:  88 year old female presenting for ultrasound and stereotactic guided biopsy of 2 areas in the LEFT breast. History of adenocarcinoma the RIGHT distal femur, with unknown origin but favored breast origin. EXAM: ULTRASOUND GUIDED LEFT BREAST CORE NEEDLE BIOPSY; LEFT BREAST STEREOTACTIC CORE NEEDLE BIOPSY COMPARISON:  Previous exam(s). PROCEDURE: I met with the patient and we discussed the procedure  of ultrasound-guided biopsy and stereotactic guided biopsy, including benefits and alternatives. We discussed the high likelihood of a successful procedure. We discussed the risks of the procedure, including infection, bleeding, tissue injury, clip migration, and inadequate sampling. Informed written consent was given. The usual time-out protocol was performed immediately prior to the procedure. Site 1: LEFT breast mass 7 o'clock position 3 cm from nipple (RIBBON clip) Lesion quadrant: Lower outer quadrant Using sterile technique and 1% Lidocaine  as local anesthetic, under direct ultrasound visualization, a 14 gauge spring-loaded device was used to perform biopsy of a mass in the left breast 7 o'clock position 3 cm from nipple using a medial approach. At the conclusion of the procedure, a ribbon shaped tissue  marker clip was deployed into the biopsy cavity. Site 2: LEFT breast calcifications upper outer quadrant (X clip) Lesion quadrant: Upper outer quadrant Using sterile technique and 1% Lidocaine  as local anesthetic, under stereotactic guidance, a 9 gauge vacuum assisted device was used to perform core needle biopsy of calcifications in the upper outer quadrant posterior depth of the left breast using a lateral approach. Specimen radiograph was performed showing representative calcifications. Specimens with calcifications are identified for pathology. At the conclusion of the procedure, an X shaped tissue marker clip was deployed into the biopsy cavity. Follow up 2 view mammogram was performed and dictated separately. IMPRESSION: 1. Ultrasound guided biopsy of a mass in the LEFT breast 7 o'clock position 3 cm from the nipple (RIBBON clip). No apparent complications. 2. Stereotactic guided biopsy of calcifications in the LEFT breast upper outer quadrant (X clip). No apparent complications. Electronically Signed: By: Dirk Arrant M.D. On: 07/17/2024 09:05   MM LT BREAST BX W LOC DEV 1ST LESION IMAGE BX SPEC STEREO GUIDE Addendum Date: 07/18/2024 ADDENDUM REPORT: 07/18/2024 12:40 ADDENDUM: PATHOLOGY revealed: Site 1. Breast, left, needle core biopsy, 7 o'clock, 3 cmfn (ribbon clip) : - BENIGN BREAST SHOWING EXTENSIVE STROMAL FIBROSIS WITH FOCAL ADENOSIS - NEGATIVE FOR MICROCALCIFICATIONS - NEGATIVE FOR ATYPIA AND CARCINOMA. Pathology results are CONCORDANT with imaging findings, per Dirk Arrant M.D. PATHOLOGY revealed: Site 2. Breast, left, needle core biopsy, upper outer posterior depth (x clip) : - BENIGN BREAST SHOWING STROMAL FIBROSIS AND FIBROMATOID CHANGE. MICROCALCIFICATIONS PRESENT WITHIN FIBROMATOID STROMA - NEGATIVE FOR ATYPIA AND CARCINOMA. Pathology results are CONCORDANT with imaging findings, per Dirk Arrant M.D. Pathology results and recommendations below were discussed with patient by  telephone on 07/18/2024 by Rock Hover RN. Patient reported biopsy site within normal limits with slight tenderness at the site. Post biopsy care instructions were reviewed, questions were answered and my direct phone number was provided to patient. Patient was instructed to call [facility] if any concerns or questions arise related to the biopsy. Recommendation: Patient instructed to follow-up with provider for subsequent or persistent concerns. If further imaging of the breast is warranted to evaluate for occult malignancy, dedicated breast MRI with and without contrast would be recommended. Pathology results reported by Rock Hover RN on 07/18/2024. Electronically Signed   By: Dirk Arrant M.D.   On: 07/18/2024 12:40   Result Date: 07/18/2024 CLINICAL DATA:  88 year old female presenting for ultrasound and stereotactic guided biopsy of 2 areas in the LEFT breast. History of adenocarcinoma the RIGHT distal femur, with unknown origin but favored breast origin. EXAM: ULTRASOUND GUIDED LEFT BREAST CORE NEEDLE BIOPSY; LEFT BREAST STEREOTACTIC CORE NEEDLE BIOPSY COMPARISON:  Previous exam(s). PROCEDURE: I met with the patient and we discussed the procedure of ultrasound-guided biopsy and stereotactic guided  biopsy, including benefits and alternatives. We discussed the high likelihood of a successful procedure. We discussed the risks of the procedure, including infection, bleeding, tissue injury, clip migration, and inadequate sampling. Informed written consent was given. The usual time-out protocol was performed immediately prior to the procedure. Site 1: LEFT breast mass 7 o'clock position 3 cm from nipple (RIBBON clip) Lesion quadrant: Lower outer quadrant Using sterile technique and 1% Lidocaine  as local anesthetic, under direct ultrasound visualization, a 14 gauge spring-loaded device was used to perform biopsy of a mass in the left breast 7 o'clock position 3 cm from nipple using a medial approach.  At the conclusion of the procedure, a ribbon shaped tissue marker clip was deployed into the biopsy cavity. Site 2: LEFT breast calcifications upper outer quadrant (X clip) Lesion quadrant: Upper outer quadrant Using sterile technique and 1% Lidocaine  as local anesthetic, under stereotactic guidance, a 9 gauge vacuum assisted device was used to perform core needle biopsy of calcifications in the upper outer quadrant posterior depth of the left breast using a lateral approach. Specimen radiograph was performed showing representative calcifications. Specimens with calcifications are identified for pathology. At the conclusion of the procedure, an X shaped tissue marker clip was deployed into the biopsy cavity. Follow up 2 view mammogram was performed and dictated separately. IMPRESSION: 1. Ultrasound guided biopsy of a mass in the LEFT breast 7 o'clock position 3 cm from the nipple (RIBBON clip). No apparent complications. 2. Stereotactic guided biopsy of calcifications in the LEFT breast upper outer quadrant (X clip). No apparent complications. Electronically Signed: By: Dirk Arrant M.D. On: 07/17/2024 09:05   MM CLIP PLACEMENT LEFT Result Date: 07/17/2024 CLINICAL DATA:  Status post ultrasound-guided biopsy of a mass in the LEFT breast and stereotactic guided biopsy of calcifications in the LEFT breast. EXAM: 3D DIAGNOSTIC LEFT MAMMOGRAM POST ULTRASOUND AND STEREOTACTIC BIOPSY COMPARISON:  Previous exam(s). ACR Breast Density Category b: There are scattered areas of fibroglandular density. FINDINGS: 3D Mammographic images were obtained following ultrasound and stereotactic guided biopsy of the left breast. The ribbon shaped biopsy marking clip is in expected position at the site of biopsy in the left breast 7 o'clock position 3 cm from nipple. The X shaped biopsy marking clip is in expected position at the site of biopsy in the left breast upper-outer quadrant posterior depth. IMPRESSION: Appropriate  positioning of the RIBBON and X shaped biopsy marking clip at the site of biopsy in the LEFT breast 7 o'clock position and upper outer quadrant, respectively. Final Assessment: Post Procedure Mammograms for Marker Placement Electronically Signed   By: Dirk Arrant M.D.   On: 07/17/2024 09:10    ASSESSMENT:  Stage IV ER/PR adenocarcinoma of the breast with metastasis to right femur and right posterior scalp.  PLAN:    Stage IV ER/PR adenocarcinoma of the breast with metastasis to right femur and right posterior scalp: Confirmed by punch biopsy of the skin on her scalp on June 21, 2024.  MRI results from May 17, 2024 reviewed independently with an aggressive appearing lesion in the right femur as well as a second indeterminate lesion.  Nuclear med bone scan on June 12, 2024 corresponds with MRI findings.  CT scan results from June 26, 2024 revealed a left breast mass, but no other significant pathology.  Will get a mammogram and biopsy to confirm that this is the actual primary.  CA 27-29 is only mildly elevated at 90.2.  Referral has been sent to radiation oncology for treatment  of her scalp and femur lesions.  Once she completes XRT, will initiate patient on letrozole  only.  Can consider Zometa or Xgeva in the future for her bony lesion.  Return to clinic at the end of XRT for further evaluation and initiation of letrozole .    Anemia: Patient's hemoglobin is mildly decreased 11.1.  Monitor. Thrombocytopenia: Mild, monitor. Pain: Continue Norco as prescribed.  Referral to radiation oncology as above.   Anxiety/coping: Patient was given a referral to [facility]  He has also been placed on Cymbalta by primary care.   Patient expressed understanding and was in agreement with this plan. She also understands that She can call clinic at any time with any questions, concerns, or complaints.    Cancer Staging  Invasive ductal carcinoma of breast, female, left [facility]) Staging form: Breast, AJCC 8th  Edition - Clinical stage from 07/04/2024: Stage IV (cT1c, cN0, pM1, ER+, PR+, HER2-) - Signed by Jacobo Evalene PARAS, MD on 07/04/2024 Stage prefix: Initial diagnosis   Evalene PARAS Jacobo, MD   08/14/2024 11:22 AM

## 2024-08-15 ENCOUNTER — Ambulatory Visit
Admission: RE | Admit: 2024-08-15 | Discharge: 2024-08-15 | Disposition: A | Discharge status: Home / Self Care | Source: Ambulatory Visit | Attending: Radiation Oncology | Admitting: Radiation Oncology

## 2024-08-15 ENCOUNTER — Other Ambulatory Visit: Payer: Self-pay

## 2024-08-15 DIAGNOSIS — C7951 Secondary malignant neoplasm of bone: Secondary | ICD-10-CM | POA: Diagnosis not present

## 2024-08-15 DIAGNOSIS — C801 Malignant (primary) neoplasm, unspecified: Secondary | ICD-10-CM | POA: Diagnosis not present

## 2024-08-15 DIAGNOSIS — Z510 Encounter for antineoplastic radiation therapy: Secondary | ICD-10-CM | POA: Diagnosis not present

## 2024-08-15 LAB — RAD ONC ARIA SESSION SUMMARY

## 2024-08-16 NOTE — Radiation Completion Notes (Signed)
 Patient Name: JAE, BRUCK MRN: [id number] Date of Birth: Nov 23, 1936 Referring Physician: ALDA CARPEN, M.D. Date of Service: 2024-08-16 Radiation Oncologist: Marcey Penton, M.D.  [facility] [place]                             RADIATION ONCOLOGY END OF TREATMENT NOTE     Diagnosis: C79.51 Secondary malignant neoplasm of bone Staging on 2024-07-04: Invasive ductal carcinoma of breast, female, left (HCC) T=cT1c, N=cN0, M=pM1 Intent: Palliative     HPI: Patient is a 88 year old female who has had a lesion on her scalp for several years.  She developed right leg pain had an MRI scan of her right leg showing a well-circumscribed lesion in the right distal femur suspicious for malignancy.  She also had a large lesion on her scalp which she thought was a nevus although on CT scan of her head previously noted 3 cm right posterior scalp lesion had grown to 8 cm in diameter with erosive changes of the calvarium and appearing aggressive and presumed be direct invasion of metastatic disease.  CT scan showed a mass in the inferior left breast containing a central calcified mass measuring 1.7 x 1.2 cm.  Bone scan also demonstrated areas of increased radiotracer activity in the distal right femur as well as the scalp consistent with known metastatic sites of disease.  She had a biopsy of her breast which was nonmalignant.  Biopsy of her scalp lesion showed metastatic adenocarcinoma ER positive.  The histologic and immunoperoxidase findings were most consistent with breast primary.  She is seen today for consideration of palliative treatment to both her scalp and distal right femur.  She is in a wheelchair now not ambulating well.  Lesion on her scalp is nonpainful.      ==========DELIVERED PLANS==========  First Treatment Date: 2024-08-02 Last Treatment Date: 2024-08-15   Plan Name: HN_Scalp Site: Scalp Technique: 3D Mode: Photon Dose Per Fraction: 3 Gy Prescribed Dose (Delivered  / Prescribed): 30 Gy / 30 Gy Prescribed Fxs (Delivered / Prescribed): 10 / 10   Plan Name: Ext_R_Femur Site: Femur Right Technique: Isodose Plan Mode: Photon Dose Per Fraction: 3 Gy Prescribed Dose (Delivered / Prescribed): 30 Gy / 30 Gy Prescribed Fxs (Delivered / Prescribed): 10 / 10     ==========ON TREATMENT VISIT DATES========== 2024-08-07, 2024-08-14, 2024-08-14     ==========UPCOMING VISITS========== 11/13/2024 [facility] EST PT Jacobo Evalene PARAS, MD  11/13/2024 [facility] LAB [facility]  09/17/2024 [facility] RAD ONCOLOGY FOLLOW UP 30 Chrystal, Glenn, MD        ==========APPENDIX - ON TREATMENT VISIT NOTES==========   See weekly On Treatment Notes in Epic for details in the Media tab (listed as Progress notes on the On Treatment Visit Dates listed above).

## 2024-09-17 ENCOUNTER — Ambulatory Visit
Admission: RE | Admit: 2024-09-17 | Discharge: 2024-09-17 | Disposition: A | Discharge status: Home / Self Care | Source: Ambulatory Visit | Attending: Radiation Oncology | Admitting: Radiation Oncology

## 2024-09-17 VITALS — BP 178/84 | HR 88 | Temp 98.3°F | Resp 16 | Wt 162.0 lb

## 2024-09-17 DIAGNOSIS — C799 Secondary malignant neoplasm of unspecified site: Secondary | ICD-10-CM | POA: Insufficient documentation

## 2024-09-17 NOTE — Progress Notes (Signed)
 Radiation Oncology Follow up Note  Name: JANIEL DERHAMMER   Date:   09/17/2024 MRN:  [id number] DOB: [birth year]    This 88 y.o. female presents to the clinic today for 1 month follow-up status post palliative radiation therapy to her scalp and calvarium as well as a distal right femur for metastatic stage IV breast cancer.  REFERRING PROVIDER: Alla Amis, MD  HPI: Patient is a 88 year old female now at 1 month having received palliative radiation therapy to a large calvarium and skin mid metastatic site as well as a right distal femur for palliation and patient with known stage IV breast cancer.  She is walking well ambulating with a cane today.  She is disease uses a walker at home she says the pain is markedly improved in her leg.  Also pain of her scalp is improved patient is still oozing and some spots although overall regressing nicely..  COMPLICATIONS OF TREATMENT: none  FOLLOW UP COMPLIANCE: keeps appointments   PHYSICAL EXAM:  BP (!) 178/84 Comment: denies S&S of HTN  Pulse 88   Temp 98.3 F (36.8 C)   Resp 16   Wt 162 lb (73.5 kg)   BMI 31.64 kg/m  Area of the scalp has markedly regressed in size also some areas of subtle ulceration are noted.  Range of motion lower extremities is not restrictive.  Motor and sensory levels are equal and symmetric in her lower extremities.  Proprioception is intact.  Well-developed well-nourished patient in NAD. HEENT reveals PERLA, EOMI, discs not visualized.  Oral cavity is clear. No oral mucosal lesions are identified. Neck is clear without evidence of cervical or supraclavicular adenopathy. Lungs are clear to A&P. Cardiac examination is essentially unremarkable with regular rate and rhythm without murmur rub or thrill. Abdomen is benign with no organomegaly or masses noted. Motor sensory and DTR levels are equal and symmetric in the upper and lower extremities. Cranial nerves II through XII are grossly intact. Proprioception is intact. No  peripheral adenopathy or edema is identified. No motor or sensory levels are noted. Crude visual fields are within normal range.  RADIOLOGY RESULTS: No current films for review  PLAN: Present time patient is doing well I will see her back in 4 months to keep an eye on her scalp lesion.  She has a follow-up appoint with Dr. Jacobo who may discuss other areas of systemic treatment at this time.  Patient knows to call sooner with any concerns.  I would like to take this opportunity to thank you for allowing me to participate in the care of your patient.SABRA Marcey Penton, MD

## 2024-10-19 NOTE — Progress Notes (Signed)
 Discharge Plan Member has been discharged from [facility]. Please see below Discharge Plan for details. If you have any questions, please contact our Clinical Team at [phone]. Name: Melissa Armstrong Date of Birth: 1936/07/07 Discharge Date: 2024-10-19 Discharged Reason: Has Declined Further Care Referring Provider: Dr. Jacobo Psychiatric Medication Recommendation Transition: N/A (no CC med recs made) Referrals: No referrals Discharge Summary: Member reports no longer needing care. No initial behavioral health evaluation was conducted. [place] Care Provider: Kedric Severin - Health Coach

## 2024-11-12 ENCOUNTER — Other Ambulatory Visit: Payer: Self-pay | Admitting: *Deleted

## 2024-11-12 DIAGNOSIS — C50912 Malignant neoplasm of unspecified site of left female breast: Secondary | ICD-10-CM

## 2024-11-13 ENCOUNTER — Inpatient Hospital Stay: Admitting: Oncology

## 2024-11-13 ENCOUNTER — Encounter: Payer: Self-pay | Admitting: Oncology

## 2024-11-13 ENCOUNTER — Inpatient Hospital Stay: Attending: Oncology

## 2024-11-13 VITALS — BP 160/90 | HR 91 | Temp 97.8°F | Resp 18 | Ht 61.0 in | Wt 168.0 lb

## 2024-11-13 DIAGNOSIS — Z923 Personal history of irradiation: Secondary | ICD-10-CM | POA: Diagnosis not present

## 2024-11-13 DIAGNOSIS — C792 Secondary malignant neoplasm of skin: Secondary | ICD-10-CM | POA: Insufficient documentation

## 2024-11-13 DIAGNOSIS — Z8041 Family history of malignant neoplasm of ovary: Secondary | ICD-10-CM | POA: Diagnosis not present

## 2024-11-13 DIAGNOSIS — Z1732 Human epidermal growth factor receptor 2 negative status: Secondary | ICD-10-CM | POA: Diagnosis not present

## 2024-11-13 DIAGNOSIS — Z1721 Progesterone receptor positive status: Secondary | ICD-10-CM | POA: Insufficient documentation

## 2024-11-13 DIAGNOSIS — C7951 Secondary malignant neoplasm of bone: Secondary | ICD-10-CM | POA: Insufficient documentation

## 2024-11-13 DIAGNOSIS — F419 Anxiety disorder, unspecified: Secondary | ICD-10-CM | POA: Insufficient documentation

## 2024-11-13 DIAGNOSIS — C50912 Malignant neoplasm of unspecified site of left female breast: Secondary | ICD-10-CM | POA: Insufficient documentation

## 2024-11-13 DIAGNOSIS — Z801 Family history of malignant neoplasm of trachea, bronchus and lung: Secondary | ICD-10-CM | POA: Insufficient documentation

## 2024-11-13 DIAGNOSIS — Z800 Family history of malignant neoplasm of digestive organs: Secondary | ICD-10-CM | POA: Insufficient documentation

## 2024-11-13 DIAGNOSIS — Z170 Estrogen receptor positive status [ER+]: Secondary | ICD-10-CM | POA: Diagnosis not present

## 2024-11-13 DIAGNOSIS — Z79811 Long term (current) use of aromatase inhibitors: Secondary | ICD-10-CM | POA: Diagnosis not present

## 2024-11-13 LAB — CBC WITH DIFFERENTIAL/PLATELET
Abs Immature Granulocytes: 0.04 K/uL (ref 0.00–0.07)
Basophils Absolute: 0.1 K/uL (ref 0.0–0.1)
Basophils Relative: 1 %
Eosinophils Absolute: 0.1 K/uL (ref 0.0–0.5)
Eosinophils Relative: 1 %
HCT: 35.3 % — ABNORMAL LOW (ref 36.0–46.0)
Hemoglobin: 12.4 g/dL (ref 12.0–15.0)
Immature Granulocytes: 1 %
Lymphocytes Relative: 26 %
Lymphs Abs: 2.1 K/uL (ref 0.7–4.0)
MCH: 29.5 pg (ref 26.0–34.0)
MCHC: 35.1 g/dL (ref 30.0–36.0)
MCV: 84 fL (ref 80.0–100.0)
Monocytes Absolute: 0.6 K/uL (ref 0.1–1.0)
Monocytes Relative: 7 %
Neutro Abs: 5 K/uL (ref 1.7–7.7)
Neutrophils Relative %: 64 %
Platelets: 149 K/uL — ABNORMAL LOW (ref 150–400)
RBC: 4.2 MIL/uL (ref 3.87–5.11)
RDW: 12.8 % (ref 11.5–15.5)
WBC: 7.9 K/uL (ref 4.0–10.5)
nRBC: 0 % (ref 0.0–0.2)

## 2024-11-13 LAB — CMP (CANCER CENTER ONLY)
ALT: 18 U/L (ref 0–44)
AST: 17 U/L (ref 15–41)
Albumin: 4.2 g/dL (ref 3.5–5.0)
Alkaline Phosphatase: 74 U/L (ref 38–126)
Anion gap: 9 (ref 5–15)
BUN: 16 mg/dL (ref 8–23)
CO2: 23 mmol/L (ref 22–32)
Calcium: 9.6 mg/dL (ref 8.9–10.3)
Chloride: 103 mmol/L (ref 98–111)
Creatinine: 0.66 mg/dL (ref 0.44–1.00)
GFR, Estimated: >60 mL/min (ref >60)
Glucose, Bld: 291 mg/dL — ABNORMAL HIGH (ref 70–99)
Potassium: 4 mmol/L (ref 3.5–5.1)
Sodium: 135 mmol/L (ref 135–145)
Total Bilirubin: 1 mg/dL (ref 0.0–1.2)
Total Protein: 7.2 g/dL (ref 6.5–8.1)

## 2024-11-13 NOTE — Progress Notes (Unsigned)
 [facility]  [phone] [phone]  ID: Melissa Armstrong OB: 06-25-1936  MR#: [id number]  RDW#:[id number]  Patient Care Team: Alla Amis, MD as PCP - General (Family Medicine) Jacobo Evalene PARAS, MD as Consulting Physician (Oncology)  CHIEF COMPLAINT: Stage IV ER/PR adenocarcinoma of the breast with metastasis to right femur and right posterior scalp.  INTERVAL HISTORY: Patient returns to clinic today for routine 1-month evaluation.  She currently feels well and is asymptomatic.  She is tolerating letrozole  without significant side effects.  She does not complain of pain and reports that she is more mobile. She has no neurologic complaints.  She denies any recent fevers or illnesses.  She has a good appetite and denies weight loss.  She has no chest pain, shortness of breath, cough, or hemoptysis.  She denies any nausea, vomiting, constipation, or diarrhea.  She has no melena or hematochezia.  She has no urinary complaints.  Patient offers no specific complaints today.  REVIEW OF SYSTEMS:   Review of Systems  Constitutional: Negative.  Negative for fever, malaise/fatigue and weight loss.  Respiratory: Negative.  Negative for cough, hemoptysis and shortness of breath.   Cardiovascular: Negative.  Negative for chest pain and leg swelling.  Gastrointestinal:  Negative for abdominal pain, blood in stool and melena.  Genitourinary: Negative.  Negative for frequency.  Musculoskeletal: Negative.  Negative for joint pain.  Skin: Negative.  Negative for rash.  Neurological: Negative.  Negative for dizziness, focal weakness, weakness and headaches.  Psychiatric/Behavioral: Negative.  The patient is not nervous/anxious.     As per HPI. Otherwise, a complete review of systems is negative.  PAST MEDICAL HISTORY: History reviewed. No pertinent past medical history.  PAST SURGICAL HISTORY: Past Surgical History:  Procedure Laterality Date   BREAST  BIOPSY Left 07/17/2024   stereo bx, x clip, path pending   BREAST BIOPSY Left 07/17/2024   US  LT BREAST BX W LOC DEV 1ST LESION IMG BX SPEC US  GUIDE 07/17/2024 [facility]   BREAST BIOPSY Left 07/17/2024   MM LT BREAST BX W LOC DEV 1ST LESION IMAGE BX SPEC STEREO GUIDE 07/17/2024 [facility]   TONSILLECTOMY      FAMILY HISTORY: Family History  Problem Relation Age of Onset   Cancer Mother        colon cancer   Cancer Father        Lung cancer   Cancer Sister        colon cancer   Cancer Niece        ovarian cancer    ADVANCED DIRECTIVES (Y/N):  N  HEALTH MAINTENANCE: Social History   Tobacco Use   Smoking status: Never   Smokeless tobacco: Never  Substance Use Topics   Alcohol use: Never   Drug use: Never     Colonoscopy:  PAP:  Bone density:  Lipid panel:  Allergies  Allergen Reactions   Penicillin G Other (See Comments)    Current Outpatient Medications  Medication Sig Dispense Refill   letrozole  (FEMARA ) 2.5 MG tablet Take 1 tablet (2.5 mg total) by mouth daily. 90 tablet 3   LORazepam (ATIVAN) 0.5 MG tablet 1-2 tabs 1 hour prior to procedure     ondansetron  (ZOFRAN ) 4 MG tablet Take 1 tablet (4 mg total) by mouth every 8 (eight) hours as needed for nausea or vomiting. 30 tablet 1   diazepam  (VALIUM ) 5 MG tablet Take 5 mg by mouth once. (Patient not taking: Reported on 11/13/2024)  DULoxetine (CYMBALTA) 30 MG capsule Take 30 mg by mouth daily. (Patient not taking: Reported on 11/13/2024)     glycerin  adult 2 g suppository Place 1 suppository rectally as needed for constipation. (Patient not taking: Reported on 11/13/2024) 12 suppository 0   HYDROcodone -acetaminophen  (NORCO/VICODIN) 5-325 MG tablet Take  1 tablet every 4-6 hours as needed for pain (Patient not taking: Reported on 11/13/2024) 60 tablet 0   No current facility-administered medications for this visit.    OBJECTIVE: Vitals:   11/13/24 1442  BP: (!) 160/90  Pulse: 91  Resp: 18   Temp: 97.8 F (36.6 C)  SpO2: 99%      Body mass index is 31.74 kg/m.    ECOG FS:0 - Asymptomatic  General: Well-developed, well-nourished, no acute distress. Eyes: Pink conjunctiva, anicteric sclera. HEENT: Normocephalic, moist mucous membranes. Lungs: No audible wheezing or coughing. Heart: Regular rate and rhythm. Abdomen: Soft, nontender, no obvious distention. Musculoskeletal: No edema, cyanosis, or clubbing. Neuro: Alert, answering all questions appropriately. Cranial nerves grossly intact. Skin: No rashes or petechiae noted. Psych: Normal affect.  LAB RESULTS:  Lab Results  Component Value Date   NA 135 11/13/2024   K 4.0 11/13/2024   CL 103 11/13/2024   CO2 23 11/13/2024   GLUCOSE 291 (H) 11/13/2024   BUN 16 11/13/2024   CREATININE 0.66 11/13/2024   CALCIUM 9.6 11/13/2024   PROT 7.2 11/13/2024   ALBUMIN 4.2 11/13/2024   AST 17 11/13/2024   ALT 18 11/13/2024   ALKPHOS 74 11/13/2024   BILITOT 1.0 11/13/2024   GFRNONAA >60 11/13/2024   GFRAA >60 07/03/2018    Lab Results  Component Value Date   WBC 7.9 11/13/2024   NEUTROABS 5.0 11/13/2024   HGB 12.4 11/13/2024   HCT 35.3 (L) 11/13/2024   MCV 84.0 11/13/2024   PLT 149 (L) 11/13/2024     STUDIES: No results found.   ASSESSMENT:  Stage IV ER/PR adenocarcinoma of the breast with metastasis to right femur and right posterior scalp.  PLAN:    Stage IV ER/PR adenocarcinoma of the breast with metastasis to right femur and right posterior scalp: Confirmed by punch biopsy of the skin on her scalp on June 21, 2024.  MRI results from May 17, 2024 reviewed independently with an aggressive appearing lesion in the right femur as well as a second indeterminate lesion.  Nuclear med bone scan on June 12, 2024 corresponds with MRI findings.  CT scan results from June 26, 2024 revealed a left breast mass, but no other significant pathology.  Biopsy of left breast mass was negative for malignancy.  CA 27-29 remains  elevated, but has trended down to 56.2.  She has now completed XRT to her femur and scalp lesions.  Continue with letrozole  only indefinitely or until progression of disease.  Can consider adding Ibrance or Kisqali in the future.  Can also consider adding Zometa for her bony lesions.  No further intervention is needed at this time.  Return to clinic in 3 months for repeat laboratory work and further evaluation.     Anemia: Resolved. Thrombocytopenia: Essentially resolved.  Patient's platelet count is 149. Pain: Resolved.  Continue Norco as needed. Anxiety/coping: Continue Cymbalta and evaluation by primary care.   Patient expressed understanding and was in agreement with this plan. She also understands that She can call clinic at any time with any questions, concerns, or complaints.    Cancer Staging  Invasive ductal carcinoma of breast, female, left [facility]) Staging form: Breast,  AJCC 8th Edition - Clinical stage from 07/04/2024: Stage IV (cT1c, cN0, pM1, ER+, PR+, HER2-) - Signed by Jacobo Evalene PARAS, MD on 07/04/2024 Stage prefix: Initial diagnosis   Evalene PARAS Jacobo, MD   11/15/2024 6:57 AM

## 2024-11-14 LAB — CANCER ANTIGEN 27.29: CA 27.29: 56.2 U/mL — ABNORMAL HIGH (ref 0.0–38.6)

## 2024-11-27 DIAGNOSIS — R600 Localized edema: Secondary | ICD-10-CM | POA: Diagnosis not present

## 2025-01-11 ENCOUNTER — Telehealth: Payer: Self-pay | Admitting: *Deleted

## 2025-01-11 ENCOUNTER — Telehealth: Payer: Self-pay

## 2025-01-11 DIAGNOSIS — C50912 Malignant neoplasm of unspecified site of left female breast: Secondary | ICD-10-CM

## 2025-01-11 DIAGNOSIS — F411 Generalized anxiety disorder: Secondary | ICD-10-CM

## 2025-01-11 NOTE — Telephone Encounter (Signed)
 Patient called with concern of a burning sensation in med chest to throat with lying down at night.  The burning sensation makes her feel like she can't breath and prevents her from sleeping.  Does have the same symptoms occasionally during the day but happens every night.  Has not taken OTC GERD medication or TUMS to see if that would relieve symptom.  Also feeling shaky and weak from waist down which is not a new symptom.  Not sure if the above symptoms could be related to anxiety.  Discussed with Dr. Jacobo, symptoms likely not oncology related.  Melissa Armstrong is recommended to contact PCP.

## 2025-01-11 NOTE — Telephone Encounter (Signed)
 Attempted to reach patient to discuss her triage call. Per [name], patient was already called.

## 2025-01-11 NOTE — Telephone Encounter (Signed)
 Patient informed MD recommendation.  She agrees and is also going to try Maalox to see if that helps.

## 2025-01-21 ENCOUNTER — Ambulatory Visit: Admitting: Radiation Oncology

## 2025-01-27 DEATH — deceased

## 2025-01-28 ENCOUNTER — Ambulatory Visit: Admitting: Radiation Oncology

## 2025-02-14 ENCOUNTER — Inpatient Hospital Stay: Admitting: Oncology

## 2025-02-14 ENCOUNTER — Inpatient Hospital Stay
